# Patient Record
Sex: Male | Born: 1990 | Race: Black or African American | Hispanic: No | Marital: Single | Smoking: Never smoker
Health system: Southern US, Community
[De-identification: ages and names within clinical notes are randomized; demographics above are authoritative.]

## PROBLEM LIST (undated history)

## (undated) ENCOUNTER — Emergency Department (HOSPITAL_COMMUNITY): Admission: EM | Discharge status: Absent without leave | Payer: Self-pay | Source: Home / Self Care

## (undated) DIAGNOSIS — Z21 Asymptomatic human immunodeficiency virus [HIV] infection status: Secondary | ICD-10-CM

## (undated) DIAGNOSIS — B20 Human immunodeficiency virus [HIV] disease: Secondary | ICD-10-CM

## (undated) HISTORY — DX: Asymptomatic human immunodeficiency virus (hiv) infection status: Z21

## (undated) HISTORY — DX: Human immunodeficiency virus (HIV) disease: B20

---
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
## 2010-06-04 ENCOUNTER — Emergency Department (HOSPITAL_COMMUNITY): Admission: EM | Admit: 2010-06-04 | Discharge: 2010-06-04 | Payer: Self-pay | Admitting: Family Medicine

## 2011-01-11 ENCOUNTER — Emergency Department (HOSPITAL_COMMUNITY): Payer: Medicaid Other

## 2011-01-11 ENCOUNTER — Emergency Department (HOSPITAL_COMMUNITY)
Admission: EM | Admit: 2011-01-11 | Discharge: 2011-01-11 | Disposition: A | Discharge status: Home / Self Care | Payer: Medicaid Other | Attending: Emergency Medicine | Admitting: Emergency Medicine

## 2011-01-11 DIAGNOSIS — N433 Hydrocele, unspecified: Secondary | ICD-10-CM | POA: Insufficient documentation

## 2011-01-11 DIAGNOSIS — N5089 Other specified disorders of the male genital organs: Secondary | ICD-10-CM | POA: Insufficient documentation

## 2011-01-11 DIAGNOSIS — N390 Urinary tract infection, site not specified: Secondary | ICD-10-CM | POA: Insufficient documentation

## 2011-01-11 DIAGNOSIS — N509 Disorder of male genital organs, unspecified: Secondary | ICD-10-CM | POA: Insufficient documentation

## 2011-01-11 LAB — URINALYSIS, ROUTINE W REFLEX MICROSCOPIC
Ketones, ur: NEGATIVE mg/dL
Nitrite: NEGATIVE
Protein, ur: NEGATIVE mg/dL

## 2011-01-13 LAB — URINE CULTURE
Colony Count: NO GROWTH
Culture: NO GROWTH

## 2011-01-14 LAB — GC/CHLAMYDIA PROBE AMP, GENITAL
Chlamydia, DNA Probe: NEGATIVE
GC Probe Amp, Genital: POSITIVE — AB

## 2011-10-25 ENCOUNTER — Emergency Department (INDEPENDENT_AMBULATORY_CARE_PROVIDER_SITE_OTHER)
Admission: EM | Admit: 2011-10-25 | Discharge: 2011-10-25 | Disposition: A | Discharge status: Home / Self Care | Payer: Self-pay | Source: Home / Self Care | Attending: Family Medicine | Admitting: Family Medicine

## 2011-10-25 ENCOUNTER — Encounter (HOSPITAL_COMMUNITY): Payer: Self-pay

## 2011-10-25 DIAGNOSIS — J069 Acute upper respiratory infection, unspecified: Secondary | ICD-10-CM

## 2011-10-25 LAB — POCT RAPID STREP A: Streptococcus, Group A Screen (Direct): NEGATIVE

## 2011-10-25 MED ORDER — IBUPROFEN 600 MG PO TABS: 600.0000 mg | ORAL_TABLET | Freq: Three times a day (TID) | ORAL | Status: AC | PRN

## 2011-10-25 NOTE — Discharge Instructions (Signed)
Your rapid strep test is negative. Is very important top keep well hydrated. Can take ibuprofen scheduled every 8 hours for the next 24-48 hours take with food and plenty of liquids as it can upset your stomach, can take over the counter prilosec while taking ibuprofen. Can also alternate with tylenol every 6 hours as needed for pain or fever. Use nasal saline spray at least 3 times a day. (simply saline is over the counter) Can use steam water (vaporizer) at bed side overnight.  Return if difficulty breathing or not keeping fluids down.

## 2011-10-25 NOTE — ED Notes (Signed)
Pt has fever, body aches, and sorethroat for 1 and 1/2 weeks.

## 2011-10-25 NOTE — ED Provider Notes (Signed)
History     CSN: [id number]  Arrival date & time 10/25/11  4098   First MD Initiated Contact with Patient 10/25/11 1114      Chief Complaint  Patient presents with  . URI    (Consider location/radiation/quality/duration/timing/severity/associated sxs/prior treatment) HPI Comments: 21 y/o male non smoker. No significant PMH here c/o sore throat,  body aches and fever  For 1 and 1/2 week. Mild non productive cough. Taking tylenol and pseudoephedrine last dose last night afebrile here. No chest pain or shortness of breath.     History reviewed. No pertinent past medical history.  History reviewed. No pertinent past surgical history.  History reviewed. No pertinent family history.  History  Substance Use Topics  . Smoking status: Not on file  . Smokeless tobacco: Not on file  . Alcohol Use: No      Review of Systems  Constitutional: Negative for fever and chills.  HENT: Positive for congestion and sore throat. Negative for ear pain, rhinorrhea, trouble swallowing, neck pain and neck stiffness.   Eyes: Negative for discharge and redness.  Respiratory: Negative for cough, chest tightness and shortness of breath.   Gastrointestinal: Negative for nausea, vomiting, abdominal pain and diarrhea.  Musculoskeletal: Positive for myalgias, back pain and arthralgias. Negative for joint swelling.  Skin: Negative for rash.  Neurological: Negative for headaches.    Allergies  Review of patient's allergies indicates no known allergies.  Home Medications   Current Outpatient Rx  Name Route Sig Dispense Refill  . ACETAMINOPHEN 500 MG PO TABS Oral Take 500 mg by mouth every 6 (six) hours as needed.    [name] Kitchen PSEUDOEPHEDRINE-ACETAMINOPHEN 30-500 MG PO TABS Oral Take 1 tablet by mouth every 4 (four) hours as needed.    . IBUPROFEN 600 MG PO TABS Oral Take 1 tablet (600 mg total) by mouth every 8 (eight) hours as needed for pain or fever (take with food). 20 tablet 0    BP 123/79  Pulse 89   Temp(Src) 98.9 F (37.2 C) (Oral)  Resp 16  SpO2 98%  Physical Exam  Nursing note and vitals reviewed. Constitutional: He is oriented to person, place, and time. He appears well-developed and well-nourished. No distress.  HENT:  Head: Normocephalic and atraumatic.  Right Ear: External ear normal.  Left Ear: External ear normal.       Mild nasal congestion with erythema and swelling of nasal turbinates, clear rhinorrhea. Mild pharyngeal erythema no exudates. No uvula deviation. No trismus. TM's normal   Eyes: Conjunctivae are normal. Pupils are equal, round, and reactive to light. Right eye exhibits no discharge. Left eye exhibits no discharge.  Neck: Normal range of motion. Neck supple.  Cardiovascular: Normal heart sounds.   Pulmonary/Chest: Effort normal and breath sounds normal. No respiratory distress. He has no wheezes. He has no rales. He exhibits no tenderness.  Abdominal: Soft. He exhibits no distension. There is no tenderness.  Lymphadenopathy:    He has no cervical adenopathy.  Neurological: He is alert and oriented to person, place, and time.  Skin: No rash noted.    ED Course  Procedures (including critical care time)   Labs Reviewed  POCT RAPID STREP A ([facility] ONLY)  LAB REPORT - SCANNED   No results found.   1. URI (upper respiratory infection)       MDM  Negative rapid strep test. Clinically well treated symptomatically.        Sharin Grave, MD 10/26/11 1231

## 2012-03-12 ENCOUNTER — Encounter (HOSPITAL_COMMUNITY): Payer: Self-pay

## 2012-03-12 ENCOUNTER — Emergency Department (INDEPENDENT_AMBULATORY_CARE_PROVIDER_SITE_OTHER)
Admission: EM | Admit: 2012-03-12 | Discharge: 2012-03-12 | Disposition: A | Discharge status: Home / Self Care | Payer: Self-pay | Source: Home / Self Care | Attending: Family Medicine | Admitting: Family Medicine

## 2012-03-12 DIAGNOSIS — J039 Acute tonsillitis, unspecified: Secondary | ICD-10-CM

## 2012-03-12 LAB — POCT RAPID STREP A: Streptococcus, Group A Screen (Direct): NEGATIVE

## 2012-03-12 MED ORDER — AMOXICILLIN-POT CLAVULANATE 875-125 MG PO TABS: 1.0000 | ORAL_TABLET | Freq: Two times a day (BID) | ORAL | Status: AC

## 2012-03-12 NOTE — Discharge Instructions (Signed)
Drink lots of fluids, take all of medicine, use lozenges as needed.return if needed °

## 2012-03-12 NOTE — ED Provider Notes (Signed)
History     CSN: [id number]  Arrival date & time 03/12/12  1311   First MD Initiated Contact with Patient 03/12/12 1410      Chief Complaint  Patient presents with  . Sore Throat    (Consider location/radiation/quality/duration/timing/severity/associated sxs/prior treatment) Patient is a 21 y.o. male presenting with pharyngitis. The history is provided by the patient.  Sore Throat This is a new problem. The current episode started 2 days ago. The problem has been gradually worsening. The symptoms are aggravated by swallowing.    History reviewed. No pertinent past medical history.  History reviewed. No pertinent past surgical history.  History reviewed. No pertinent family history.  History  Substance Use Topics  . Smoking status: Never Smoker   . Smokeless tobacco: Not on file  . Alcohol Use: No      Review of Systems  Constitutional: Positive for chills.  HENT: Positive for ear pain, congestion, sore throat and rhinorrhea.   Respiratory: Negative for cough.   Hematological: Positive for adenopathy.    Allergies  Review of patient's allergies indicates no known allergies.  Home Medications   Current Outpatient Rx  Name Route Sig Dispense Refill  . AMOXICILLIN-POT CLAVULANATE 875-125 MG PO TABS Oral Take 1 tablet by mouth 2 (two) times daily. 20 tablet 0    BP 157/96  Pulse 74  Temp 98.9 F (37.2 C) (Oral)  Resp 18  SpO2 98%  Physical Exam  Nursing note and vitals reviewed. Constitutional: He appears well-developed and well-nourished.  HENT:  Head: Normocephalic.  Right Ear: External ear normal.  Left Ear: External ear normal.  Mouth/Throat: Uvula is midline and mucous membranes are normal. Oropharyngeal exudate and posterior oropharyngeal erythema present.  Lymphadenopathy:    He has cervical adenopathy.  Skin: No rash noted.    ED Course  Procedures (including critical care time)  Labs Reviewed  GLUCOSE, CAPILLARY - Abnormal; Notable for  the following:    Glucose-Capillary 106 (*)     All other components within normal limits  POCT RAPID STREP A ([facility] ONLY)   No results found.   1. Tonsillitis with exudate       MDM          Linna Hoff, MD 03/21/12 1234

## 2012-03-12 NOTE — ED Notes (Signed)
Cough for 2 weeks, ear pressure, discomfort, ST, dry cough; tonsils swollen, red , exudatve

## 2012-05-24 IMAGING — US US ART/VEN ABD/PELV/SCROTUM DOPPLER LTD
1 series · 14 of 25 positions shown · non-contrast
Comparison: None

CLINICAL DATA: right testicular swelling, pain.

SCROTAL ULTRASOUND
DOPPLER ULTRASOUND OF THE TESTICLES
TECHNIQUE: Complete ultrasound examination of the testicles,
epididymis, and other scrotal structures was performed.  Color and
spectral Doppler ultrasound were also utilized to evaluate blood
flow to the testicles.

[Series 1: us art/ven abd/pelv/scrotum doppler ltd · 0.08mm/px · 14 of 54 slices shown]
[im 1/54]
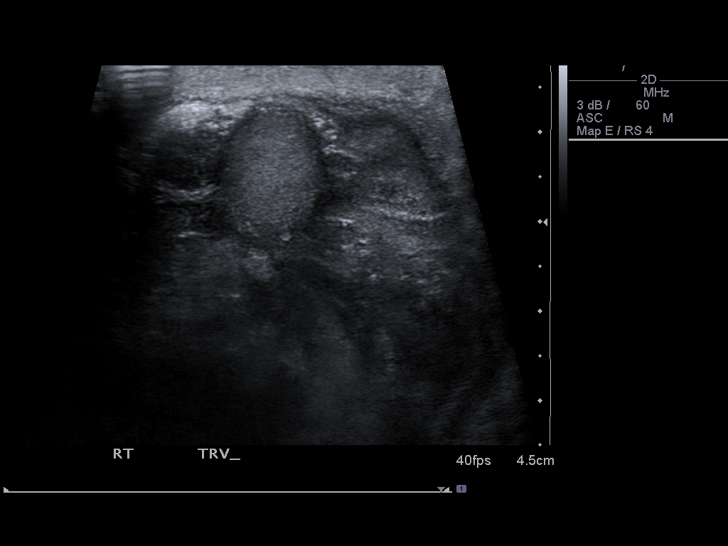
[im 5/54]
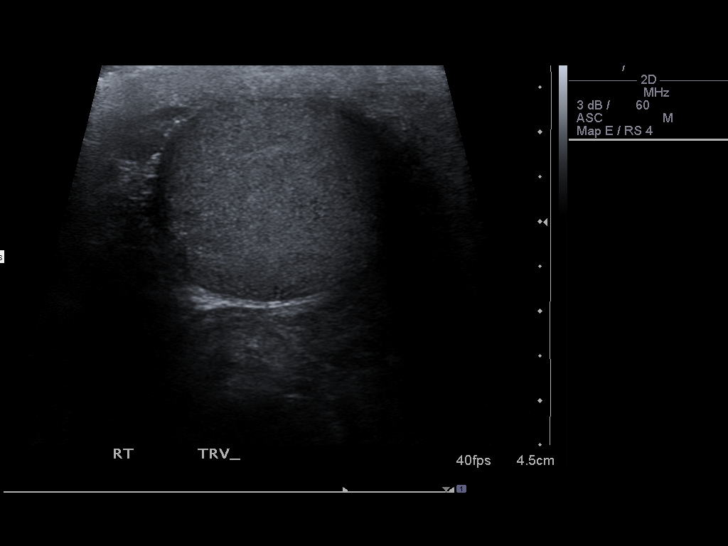
[im 9/54]
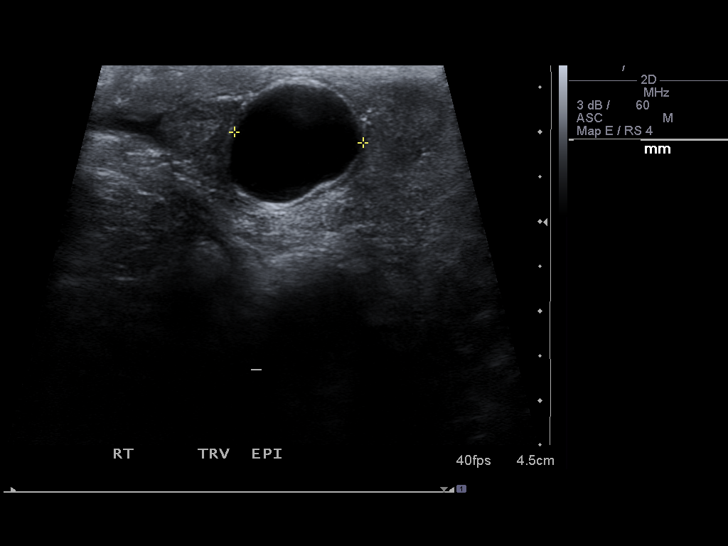
[im 14/54]
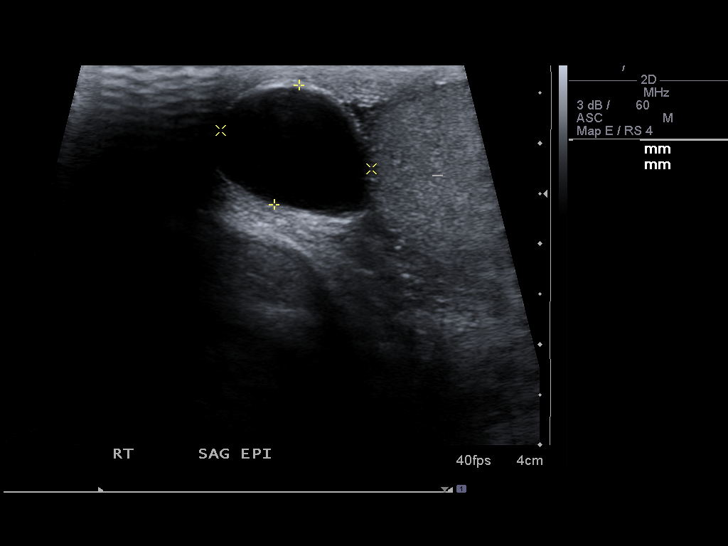
[im 18/54]
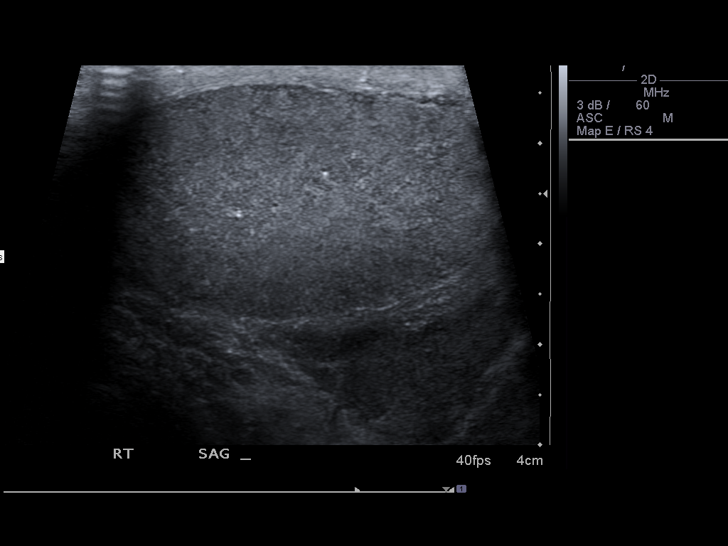
[im 20/54]
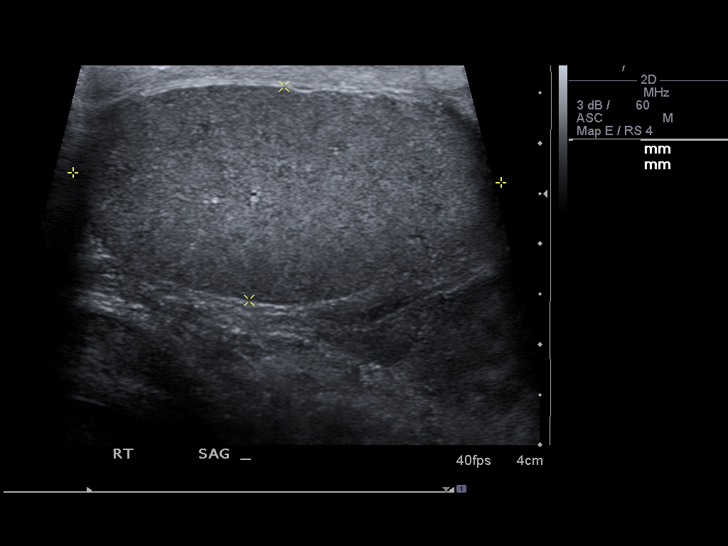
[im 25/54]
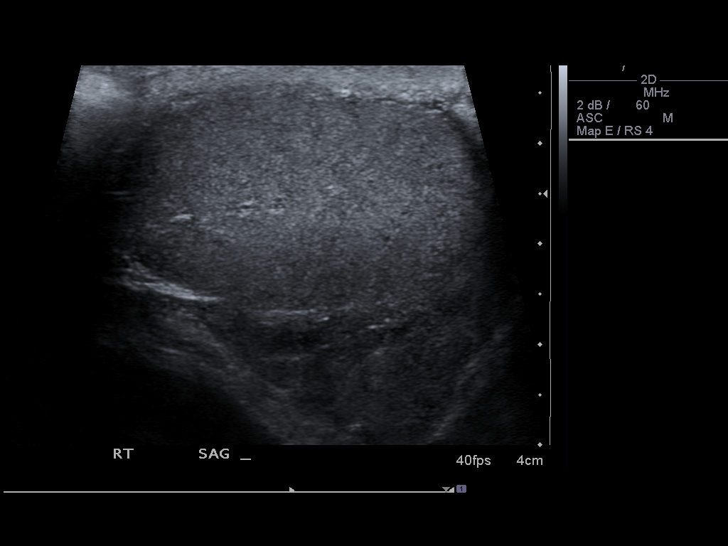
[im 29/54]
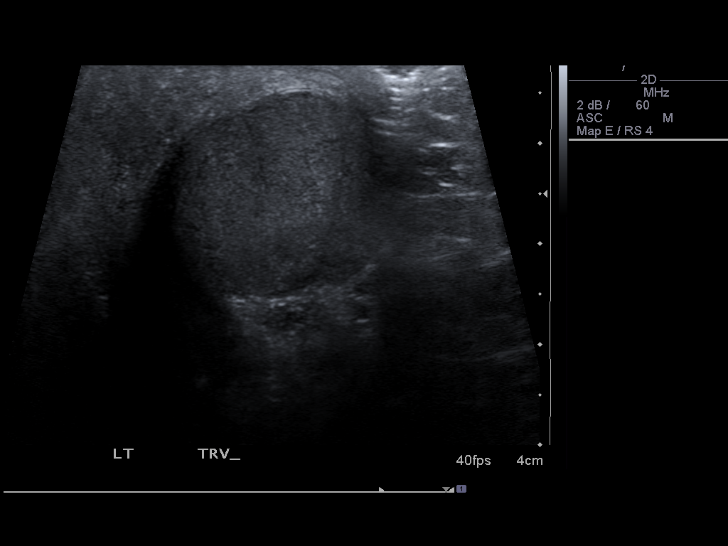
[im 34/54]
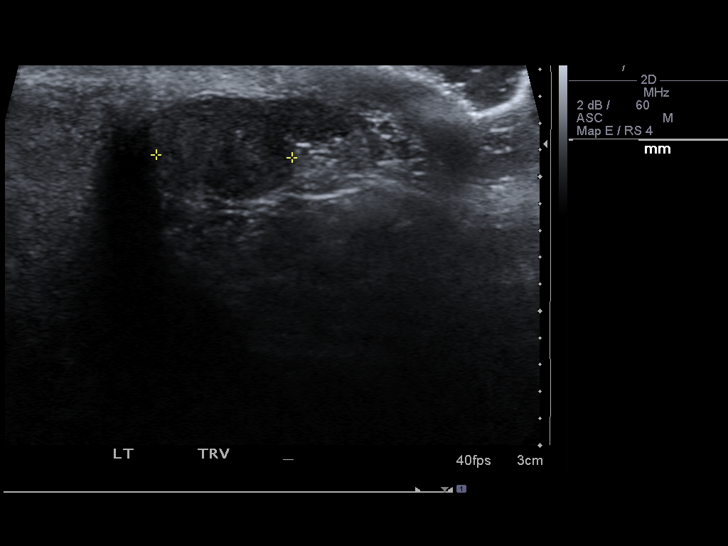
[im 36/54]
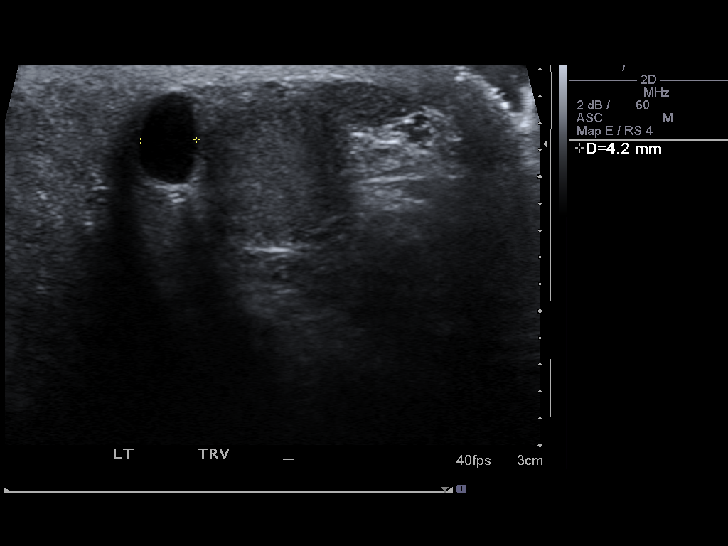
[im 40/54]
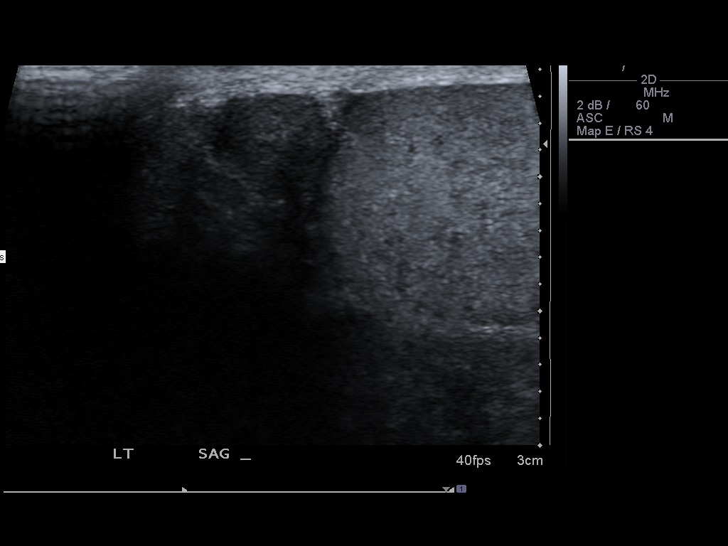
[im 45/54]
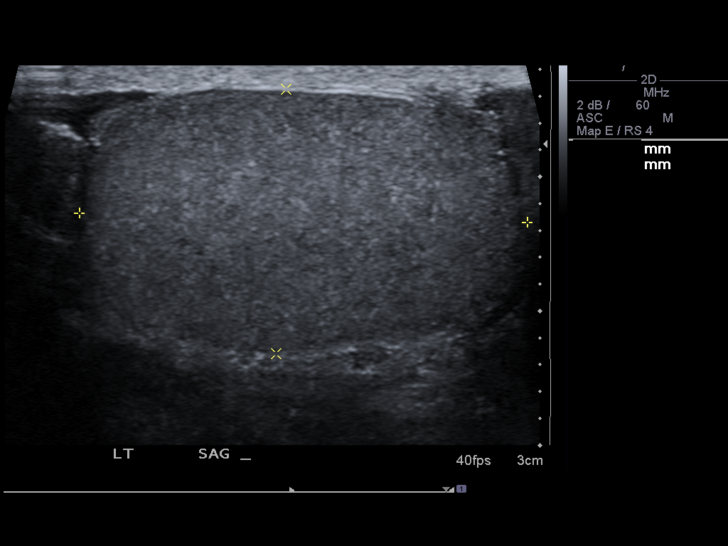
[im 49/54]
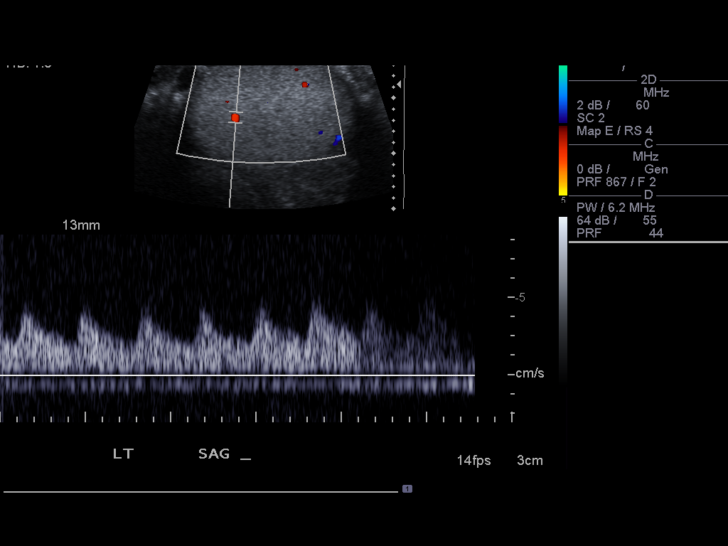
[im 54/54]
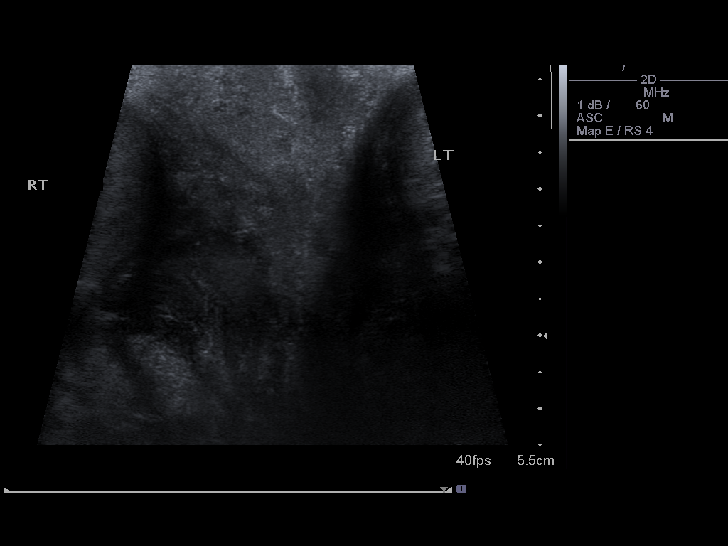

[14 of 25 positions shown; findings below may reference images not displayed]

FINDINGS: Right testicle measures 4.3 x 2.2 x 2.7cm.  Left testicle
measures 3.3 x 2.0 x 2.3cm.  Normal size and symmetric and
echotexture.  No focal masses.  No evidence of torsion.  Normal
arterial and venous blood flow documented.

Bilateral epididymal cysts, right larger than left.  There are
small hydroceles, right slightly larger than left.  No varicocele.
IMPRESSION: Testes are unremarkable.  No evidence of torsion.

Bilateral epididymal head cysts and hydroceles, right larger than
left.

## 2012-07-14 ENCOUNTER — Encounter (HOSPITAL_COMMUNITY): Payer: Self-pay

## 2012-07-14 ENCOUNTER — Emergency Department (HOSPITAL_COMMUNITY)
Admission: EM | Admit: 2012-07-14 | Discharge: 2012-07-14 | Disposition: A | Discharge status: Home / Self Care | Payer: Self-pay | Attending: Emergency Medicine | Admitting: Emergency Medicine

## 2012-07-14 DIAGNOSIS — Z23 Encounter for immunization: Secondary | ICD-10-CM | POA: Insufficient documentation

## 2012-07-14 DIAGNOSIS — L0231 Cutaneous abscess of buttock: Secondary | ICD-10-CM | POA: Insufficient documentation

## 2012-07-14 DIAGNOSIS — L0291 Cutaneous abscess, unspecified: Secondary | ICD-10-CM

## 2012-07-14 DIAGNOSIS — L03317 Cellulitis of buttock: Secondary | ICD-10-CM | POA: Insufficient documentation

## 2012-07-14 MED ORDER — OXYCODONE-ACETAMINOPHEN 5-325 MG PO TABS
2.0000 | ORAL_TABLET | Freq: Once | ORAL | Status: AC
  Administered 2012-07-14: 2 via ORAL
  Filled 2012-07-14: qty 2

## 2012-07-14 MED ORDER — TETANUS-DIPHTH-ACELL PERTUSSIS 5-2.5-18.5 LF-MCG/0.5 IM SUSP
0.5000 mL | Freq: Once | INTRAMUSCULAR | Status: AC
  Administered 2012-07-14: 0.5 mL via INTRAMUSCULAR
  Filled 2012-07-14: qty 0.5

## 2012-07-14 MED ORDER — IBUPROFEN 800 MG PO TABS: 800.0000 mg | ORAL_TABLET | Freq: Three times a day (TID) | ORAL | Status: DC

## 2012-07-14 MED ORDER — OXYCODONE-ACETAMINOPHEN 5-325 MG PO TABS: 1.0000 | ORAL_TABLET | ORAL | Status: DC | PRN

## 2012-07-14 MED ORDER — LIDOCAINE HCL 2 % IJ SOLN
10.0000 mL | Freq: Once | INTRAMUSCULAR | Status: AC
  Administered 2012-07-14: 200 mg via INTRADERMAL

## 2012-07-14 NOTE — ED Notes (Signed)
Pt complains of abcess to bottom, sts hx of same, and this time it is not subsiding. Denies draingage.

## 2012-07-14 NOTE — ED Notes (Signed)
Pt given extra supplies for dressing change at home. Verbalized understanding of discharge instructions.

## 2012-07-14 NOTE — ED Provider Notes (Signed)
History     CSN: [id number]  Arrival date & time 07/14/12  1346   First MD Initiated Contact with Patient 07/14/12 1417      Chief Complaint  Patient presents with  . Abscess    (Consider location/radiation/quality/duration/timing/severity/associated sxs/prior treatment) Patient is a 21 y.o. male presenting with abscess. The history is provided by the patient. No language interpreter was used.  Abscess  This is a recurrent problem. The current episode started less than one week ago. The onset was gradual. The problem occurs rarely. The problem has been gradually worsening. Affected Location: buttock crack. The abscess is characterized by redness and painfulness.    No past medical history on file.  No past surgical history on file.  No family history on file.  History  Substance Use Topics  . Smoking status: Never Smoker   . Smokeless tobacco: Not on file  . Alcohol Use: No      Review of Systems  Constitutional: Negative.   HENT: Negative.   Eyes: Negative.   Respiratory: Negative.   Cardiovascular: Negative.   Gastrointestinal: Negative.   Neurological: Negative.   Psychiatric/Behavioral: Negative.   All other systems reviewed and are negative.    Allergies  Review of patient's allergies indicates no known allergies.  Home Medications  No current outpatient prescriptions on file.  BP 166/103  Pulse 85  Temp 98.5 F (36.9 C) (Oral)  Resp 18  SpO2 100%  Physical Exam  Nursing note and vitals reviewed. Constitutional: He is oriented to person, place, and time. He appears well-developed and well-nourished.  HENT:  Head: Normocephalic.  Eyes: Conjunctivae normal and EOM are normal. Pupils are equal, round, and reactive to light.  Neck: Normal range of motion. Neck supple.  Cardiovascular: Normal rate.   Pulmonary/Chest: Effort normal.  Abdominal: Soft.  Musculoskeletal: Normal range of motion. He exhibits tenderness.       buttocks  Neurological:  He is alert and oriented to person, place, and time.  Skin: Skin is warm and dry.       Abscess to buttocks no cellulitis  Psychiatric: He has a normal mood and affect.    ED Course  INCISION AND DRAINAGE Date/Time: 07/14/2012 3:10 PM Performed by: Remi Haggard Authorized by: Remi Haggard Consent: Verbal consent obtained. Written consent not obtained. Risks and benefits: risks, benefits and alternatives were discussed Consent given by: patient Patient understanding: patient states understanding of the procedure being performed Patient identity confirmed: verbally with patient, arm band, provided demographic data and hospital-assigned identification number Time out: Immediately prior to procedure a "time out" was called to verify the correct patient, procedure, equipment, support staff and site/side marked as required. Type: abscess Location: buttocks. Anesthesia: local infiltration Local anesthetic: lidocaine 2% without epinephrine Anesthetic total: 5 ml Patient sedated: no Scalpel size: 11 Complexity: simple Drainage: purulent and serosanguinous Drainage amount: moderate Wound treatment: wound left open Packing material: 1/4 in iodoform gauze Patient tolerance: Patient tolerated the procedure well with no immediate complications.   (including critical care time)  Labs Reviewed - No data to display No results found.   No diagnosis found.    MDM  I & D abscess without cellulitis to buttocks packed.  Return in 24 hours for packing removal and recheck.  No pcp.  Tetanus updated.  rx for percocet/ibuprofen.        Remi Haggard, NP 07/15/12 1912

## 2012-07-15 NOTE — ED Provider Notes (Signed)
Medical screening examination/treatment/procedure(s) were performed by non-physician practitioner and as supervising physician I was immediately available for consultation/collaboration.  Toy Baker, MD 07/15/12 2223

## 2012-09-26 ENCOUNTER — Encounter (HOSPITAL_COMMUNITY): Payer: Self-pay | Admitting: Adult Health

## 2012-09-26 ENCOUNTER — Emergency Department (HOSPITAL_COMMUNITY)
Admission: EM | Admit: 2012-09-26 | Discharge: 2012-09-26 | Disposition: A | Discharge status: Home / Self Care | Payer: Self-pay | Attending: Emergency Medicine | Admitting: Emergency Medicine

## 2012-09-26 DIAGNOSIS — R51 Headache: Secondary | ICD-10-CM | POA: Insufficient documentation

## 2012-09-26 DIAGNOSIS — J029 Acute pharyngitis, unspecified: Secondary | ICD-10-CM | POA: Insufficient documentation

## 2012-09-26 MED ORDER — OXYCODONE-ACETAMINOPHEN 5-325 MG PO TABS: 2.0000 | ORAL_TABLET | ORAL | Status: DC | PRN

## 2012-09-26 MED ORDER — DYCLONINE HCL 3 MG MT LOZG: 1.0000 | LOZENGE | OROMUCOSAL | Status: DC | PRN

## 2012-09-26 MED ORDER — BENZOCAINE-MENTHOL 15-10 MG MT LOZG: 1.0000 | LOZENGE | OROMUCOSAL | Status: DC | PRN

## 2012-09-26 MED ORDER — IBUPROFEN 800 MG PO TABS: 800.0000 mg | ORAL_TABLET | Freq: Three times a day (TID) | ORAL | Status: DC

## 2012-09-26 NOTE — ED Provider Notes (Signed)
History   This chart was scribed for Sergio Skene, MD by Melba Coon, ED Scribe. The patient was seen in room TR09C/TR09C and the patient's care was started at 7:28PM.   CSN: [id number]  Arrival date & time 09/26/12  1814   First MD Initiated Contact with Patient 09/26/12 1918      Chief Complaint  Patient presents with  . Sore Throat    (Consider location/radiation/quality/duration/timing/severity/associated sxs/prior treatment) The history is provided by the patient. No language interpreter was used.   Sergio Hatfield is a 22 y.o. male who presents to the Emergency Department complaining of persistent, moderate to severe sore throat with an onset yesterday. He reports that the pain started on the left side of his throat but has moved to the right side of his throat. He reports that ibuprofen, Tylenol and percocet have not alleviated the pain. Reports headache, difficulty swallowing, and muffled voice change. Denies cough, nasal congestion, rhinnorhea, fever, neck pain, rash, back pain, CP, SOB, abdominal pain, nausea, emesis, diarrhea, dysuria, or extremity pain, edema, weakness, numbness, or tingling. No known allergies. No other pertinent medical symptoms.  History reviewed. No pertinent past medical history.  History reviewed. No pertinent past surgical history.  History reviewed. No pertinent family history.  History  Substance Use Topics  . Smoking status: Never Smoker   . Smokeless tobacco: Not on file  . Alcohol Use: No      Review of Systems 10 Systems reviewed and all are negative for acute change except as noted in the HPI.   Allergies  Review of patient's allergies indicates no known allergies.  Home Medications  No current outpatient prescriptions on file.  BP 149/84  Pulse 80  Temp 98.5 F (36.9 C) (Oral)  Resp 18  SpO2 99%  Physical Exam  Nursing notes reviewed.  Electronic medical record reviewed. VITAL SIGNS:   Filed Vitals:   09/26/12 1816   BP: 149/84  Pulse: 80  Temp: 98.5 F (36.9 C)  TempSrc: Oral  Resp: 18  SpO2: 99%   CONSTITUTIONAL: Awake, oriented, appears non-toxic HENT: Atraumatic, normocephalic, oral mucosa pink and moist. Erythema to the tonsils, no exudate, mild swelling. Airway is patent - patient handling secretions and not having any trouble swallowing. Nares patent without drainage. External ears normal. EYES: Conjunctiva clear, EOMI, PERRLA NECK: Trachea midline, non-tender, supple CARDIOVASCULAR: Normal heart rate, Normal rhythm, No murmurs, rubs, gallops PULMONARY/CHEST: Clear to auscultation, no rhonchi, wheezes, or rales. Symmetrical breath sounds. Non-tender. ABDOMINAL: Non-distended, soft, non-tender - no rebound or guarding.  BS normal. NEUROLOGIC: Non-focal, moving all four extremities, no gross sensory or motor deficits. EXTREMITIES: No clubbing, cyanosis, or edema SKIN: Warm, Dry, No erythema, No rash  ED Course  Procedures (including critical care time)  COORDINATION OF CARE:  7:33PM - rapid strep screen and ibuprofen will be ordered for Sergio Hatfield.      Labs Reviewed  RAPID STREP SCREEN  LAB REPORT - SCANNED   No results found.   1. Viral pharyngitis       MDM  Sergio Hatfield is a 22 y.o. male presents with a sore throat. He was swabbed in triage, the rapid strep is negative - clinically he does not appear to have streptococcal pharyngitis. More likely has a viral syndrome upper respiratory infection and viral pharyngitis, given some pain medicine to go home with ibuprofen as well as a few cough drops with topical anesthetic properties to help him maintain his fluid hydration.  I explained  the diagnosis and have given explicit precautions to return to the ER including inability to swallow his saliva, worsening swelling, throat pain or any other new or worsening symptoms. The patient understands and accepts the medical plan as it's been dictated and I have answered their  questions. Discharge instructions concerning home care and prescriptions have been given.  The patient is STABLE and is discharged to home in good condition.  I personally performed the services described in this documentation, which was scribed in my presence. The recorded information has been reviewed and is accurate. Sergio Hatfield, M.D.          Sergio Skene, MD 09/28/12 1610

## 2012-09-26 NOTE — ED Notes (Signed)
Presents with sore throat for one week that has worsened over the last week. Throat red, associated with headache.

## 2012-09-26 NOTE — ED Notes (Signed)
MD at bedside. 

## 2012-09-26 NOTE — ED Notes (Signed)
Pt states that he does not know why he is being discharged since he has had a sore throat for the past 6 months and meds prescribed has not helped him previously.

## 2012-10-31 ENCOUNTER — Encounter (HOSPITAL_COMMUNITY): Payer: Self-pay | Admitting: Emergency Medicine

## 2012-10-31 ENCOUNTER — Emergency Department (HOSPITAL_COMMUNITY): Payer: Self-pay

## 2012-10-31 ENCOUNTER — Emergency Department (HOSPITAL_COMMUNITY)
Admission: EM | Admit: 2012-10-31 | Discharge: 2012-11-01 | Disposition: A | Discharge status: Home / Self Care | Payer: Medicaid Other | Attending: Emergency Medicine | Admitting: Emergency Medicine

## 2012-10-31 DIAGNOSIS — R42 Dizziness and giddiness: Secondary | ICD-10-CM | POA: Insufficient documentation

## 2012-10-31 DIAGNOSIS — R51 Headache: Secondary | ICD-10-CM | POA: Insufficient documentation

## 2012-10-31 DIAGNOSIS — R519 Headache, unspecified: Secondary | ICD-10-CM | POA: Diagnosis present

## 2012-10-31 DIAGNOSIS — R509 Fever, unspecified: Secondary | ICD-10-CM | POA: Diagnosis present

## 2012-10-31 LAB — CBC WITH DIFFERENTIAL/PLATELET
Basophils Relative: 1 % (ref 0–1)
Eosinophils Relative: 0 % (ref 0–5)
Hemoglobin: 16.1 g/dL (ref 13.0–17.0)
MCH: 29 pg (ref 26.0–34.0)
Monocytes Absolute: 0.6 10*3/uL (ref 0.1–1.0)
Monocytes Relative: 6 % (ref 3–12)
Neutrophils Relative %: 70 % (ref 43–77)
RBC: 5.55 MIL/uL (ref 4.22–5.81)

## 2012-10-31 LAB — BASIC METABOLIC PANEL
BUN: 9 mg/dL (ref 6–23)
CO2: 25 mEq/L (ref 19–32)
Chloride: 101 mEq/L (ref 96–112)
Glucose, Bld: 107 mg/dL — ABNORMAL HIGH (ref 70–99)
Potassium: 3.9 mEq/L (ref 3.5–5.1)

## 2012-10-31 MED ORDER — ACETAMINOPHEN 325 MG PO TABS
650.0000 mg | ORAL_TABLET | Freq: Once | ORAL | Status: AC
  Administered 2012-10-31: 650 mg via ORAL
  Filled 2012-10-31: qty 2

## 2012-10-31 MED ORDER — ONDANSETRON HCL 4 MG/2ML IJ SOLN
4.0000 mg | Freq: Once | INTRAMUSCULAR | Status: AC
  Administered 2012-10-31: 4 mg via INTRAVENOUS
  Filled 2012-10-31: qty 2

## 2012-10-31 MED ORDER — FENTANYL CITRATE 0.05 MG/ML IJ SOLN
50.0000 ug | Freq: Once | INTRAMUSCULAR | Status: AC
  Administered 2012-10-31: 50 ug via INTRAVENOUS
  Filled 2012-10-31: qty 2

## 2012-10-31 MED ORDER — DEXAMETHASONE SODIUM PHOSPHATE 10 MG/ML IJ SOLN
10.0000 mg | Freq: Once | INTRAMUSCULAR | Status: AC
  Administered 2012-10-31: 10 mg via INTRAVENOUS
  Filled 2012-10-31: qty 1

## 2012-10-31 MED ORDER — SODIUM CHLORIDE 0.9 % IV BOLUS (SEPSIS)
1000.0000 mL | Freq: Once | INTRAVENOUS | Status: AC
  Administered 2012-10-31: 1000 mL via INTRAVENOUS

## 2012-10-31 MED ORDER — DIPHENHYDRAMINE HCL 50 MG/ML IJ SOLN
25.0000 mg | Freq: Once | INTRAMUSCULAR | Status: AC
  Administered 2012-10-31: 25 mg via INTRAVENOUS
  Filled 2012-10-31: qty 1

## 2012-10-31 NOTE — ED Notes (Signed)
Lumbar puncture tray set up at bedside; PA and EDP notified.

## 2012-10-31 NOTE — ED Notes (Signed)
Patient complaining of headache and dizziness for the last two weeks; felt that it was related to hypertension.  Patient reports that last time he was seen, his blood pressure was high, but does not have official diagnosis of hypertension.  Patient also complaining of body aches that started three days ago.  Denies recent cold symptoms and sore throat.  Patient alert and oriented x4; PERRL present.  Tiffany, PA at bedside.  Will continue to monitor.

## 2012-10-31 NOTE — ED Notes (Signed)
Phlebotomy at bedside.

## 2012-10-31 NOTE — ED Notes (Signed)
Patient currently resting quietly in bed; no respiratory or acute distress noted.  Patient requesting family member to be brought back; family member called in waiting room.  Patient denies any needs at this time; will continue to monitor.

## 2012-10-31 NOTE — ED Notes (Signed)
Consent form for lumbar puncture signed by patient; placed in patient's chart.

## 2012-10-31 NOTE — ED Notes (Signed)
Pt c/o frontal HA x 2 weeks with fever and body aches x unknown amount of time; pt sts some dizziness when standing

## 2012-10-31 NOTE — ED Notes (Signed)
Patient currently sitting up in bed; no respiratory or acute distress noted.  Patient updated on plan of care; informed patient that we are currently waiting on radiology to call and say that they are ready for patient.  Patient denies any other needs at this time; will continue to monitor.

## 2012-10-31 NOTE — ED Provider Notes (Signed)
History     CSN: [id number]  Arrival date & time 10/31/12  1530   First MD Initiated Contact with Patient 10/31/12 1734      Chief Complaint  Patient presents with  . Fever  . Headache    (Consider location/radiation/quality/duration/timing/severity/associated sxs/prior treatment) HPI  Pt to the ER with complaints of headache which is worse in the front and fever for 2 weeks. He says that the headache has been mild for the past two weeks but the fever started to rise within the past few days. Yesterday and today he has remained in bed with fever and feels sick. He describes his headache as 10/10 without neck pain. No decrease in vision, body aches, nasal congestion, sore throat, ear pain, N/V/D. The patient is diaphoretic and appears toxic. He is responding to my questions appropriately. He is febrile and tachycardia.    History reviewed. No pertinent past medical history.  History reviewed. No pertinent past surgical history.  History reviewed. No pertinent family history.  History  Substance Use Topics  . Smoking status: Never Smoker   . Smokeless tobacco: Not on file  . Alcohol Use: No      Review of Systems  Review of Systems  Gen: no weight loss,, chills, night sweats +fevers Eyes: no discharge or drainage, no occular pain or visual changes  Nose: no epistaxis or rhinorrhea  Mouth: no dental pain, no sore throat  Neck: no neck pain  Lungs:No wheezing, coughing or hemoptysis CV: no chest pain, palpitations, dependent edema or orthopnea  Abd: no abdominal pain, nausea, vomiting  GU: no dysuria or gross hematuria  MSK:  No abnormalities  Neuro: + headache, no focal neurologic deficits  Skin: no abnormalities Psyche: negative.   Allergies  Review of patient's allergies indicates no known allergies.  Home Medications  No current outpatient prescriptions on file.  BP 139/74  Pulse 117  Temp(Src) 99.6 F (37.6 C) (Oral)  Resp 18  SpO2 100%  Physical  Exam  Nursing note and vitals reviewed. Constitutional: He is oriented to person, place, and time. He appears well-developed and well-nourished. No distress.  Pt appears toxic  HENT:  Head: Normocephalic and atraumatic.  Eyes: Pupils are equal, round, and reactive to light.  Neck: Normal range of motion. Neck supple. Muscular tenderness present. No spinous process tenderness present. No rigidity. Normal range of motion present. No Brudzinski's sign and no Kernig's sign noted.  Cardiovascular: Normal rate and regular rhythm.   Pulmonary/Chest: Effort normal.  Abdominal: Soft.  Neurological: He is alert and oriented to person, place, and time. He has normal strength. No cranial nerve deficit or sensory deficit.  Skin: Skin is warm and dry.    ED Course  LUMBAR PUNCTURE Date/Time: 10/31/2012 9:21 PM Performed by: Dorthula Matas Authorized by: Dorthula Matas Consent: Verbal consent obtained. Risks and benefits: risks, benefits and alternatives were discussed Consent given by: patient Patient understanding: patient states understanding of the procedure being performed Site marked: the operative site was marked Patient identity confirmed: verbally with patient Time out: Immediately prior to procedure a "time out" was called to verify the correct patient, procedure, equipment, support staff and site/side marked as required. Anesthesia: local infiltration Local anesthetic: lidocaine 1% without epinephrine Anesthetic total: 10 ml Patient sedated: no Lumbar space: L3-L4 interspace Patient's position: sitting Needle gauge: 22 Needle length: 5.0 in Number of attempts: 2 Comments: Attempt was unsuccessful by myself and Dr. Anitra Lauth as needle not long enough to reach goal   (  including critical care time)  Labs Reviewed  BASIC METABOLIC PANEL - Abnormal; Notable for the following:    Glucose, Bld 107 (*)    All other components within normal limits  CBC WITH DIFFERENTIAL   No  results found.   No diagnosis found.    MDM  CRITICAL CARE Performed by: Dorthula Matas   Total critical care time: 35  Critical care time was exclusive of separately billable procedures and treating other patients.  Critical care was necessary to treat or prevent imminent or life-threatening deterioration.  Critical care was time spent personally by me on the following activities: development of treatment plan with patient and/or surrogate as well as nursing, discussions with consultants, evaluation of patient's response to treatment, examination of patient, obtaining history from patient or surrogate, ordering and performing treatments and interventions, ordering and review of laboratory studies, ordering and review of radiographic studies, pulse oximetry and re-evaluation of patient's condition.  Head CT normal. LP attempted but unable to reach CSF due to body habitus. The patient continues to have headache despite treatment. Fever has remained low grade at 99.6 but he continues to sweat mildly.   Radiology consulted to do LP, they agreed to do it. Pt has been taken to radiology and we are currently awaiting results of CSF from procedure.    After discussing with Dr. Theodoro Kalata- ordered Rocephin and Vancomycin as well as more fluids.  Pt handed off to Sabino Dick, NP and Dr. Dierdre Highman.   Dorthula Matas, PA 11/01/12 [phone]

## 2012-10-31 NOTE — ED Notes (Signed)
Patient currently resting quietly in bed; no respiratory or acute distress noted.  Patient updated on plan of are; informed patient that he is currently pending lumbar puncture.  Patient denies any needs at this time; will continue to monitor.

## 2012-10-31 NOTE — ED Notes (Signed)
Patient currently resting quietly in bed; no respiratory or acute distress noted.  Patient currently pending for DG lumbar puncture fluoro guide.  Radiology called and stated that procedure has been approved, but that radiologist was behind at this time.  Patient updated on delay; verbalized understand.  Denies any needs at this time.  Family members (now at bedside) given water to drink.  Will continue to monitor.

## 2012-11-01 ENCOUNTER — Emergency Department (HOSPITAL_COMMUNITY)
Admission: EM | Admit: 2012-11-01 | Discharge: 2012-11-01 | Disposition: A | Discharge status: Home / Self Care | Payer: Medicaid Other | Attending: Emergency Medicine | Admitting: Emergency Medicine

## 2012-11-01 ENCOUNTER — Encounter (HOSPITAL_COMMUNITY): Payer: Self-pay | Admitting: Nurse Practitioner

## 2012-11-01 DIAGNOSIS — A879 Viral meningitis, unspecified: Secondary | ICD-10-CM | POA: Insufficient documentation

## 2012-11-01 DIAGNOSIS — R519 Headache, unspecified: Secondary | ICD-10-CM | POA: Diagnosis present

## 2012-11-01 DIAGNOSIS — R509 Fever, unspecified: Secondary | ICD-10-CM | POA: Insufficient documentation

## 2012-11-01 DIAGNOSIS — M542 Cervicalgia: Secondary | ICD-10-CM | POA: Insufficient documentation

## 2012-11-01 DIAGNOSIS — M545 Low back pain, unspecified: Secondary | ICD-10-CM | POA: Insufficient documentation

## 2012-11-01 DIAGNOSIS — R51 Headache: Secondary | ICD-10-CM

## 2012-11-01 DIAGNOSIS — Z79899 Other long term (current) drug therapy: Secondary | ICD-10-CM | POA: Insufficient documentation

## 2012-11-01 LAB — CBC WITH DIFFERENTIAL/PLATELET
Basophils Absolute: 0 10*3/uL (ref 0.0–0.1)
Basophils Relative: 0 % (ref 0–1)
HCT: 41.4 % (ref 39.0–52.0)
MCH: 28.4 pg (ref 26.0–34.0)
MCHC: 33.1 g/dL (ref 30.0–36.0)
MCV: 85.9 fL (ref 78.0–100.0)
Neutro Abs: 4.8 10*3/uL (ref 1.7–7.7)
Neutrophils Relative %: 61 % (ref 43–77)
RBC: 4.82 MIL/uL (ref 4.22–5.81)
RDW: 13.6 % (ref 11.5–15.5)
WBC: 7.9 10*3/uL (ref 4.0–10.5)

## 2012-11-01 LAB — CSF CELL COUNT WITH DIFFERENTIAL
Monocyte-Macrophage-Spinal Fluid: 22 % (ref 15–45)
RBC Count, CSF: 3640 /mm3 — ABNORMAL HIGH
RBC Count, CSF: 8595 /mm3 — ABNORMAL HIGH
Segmented Neutrophils-CSF: 67 % — ABNORMAL HIGH (ref 0–6)
WBC, CSF: 26 /mm3 (ref 0–5)
WBC, CSF: 28 /mm3 (ref 0–5)

## 2012-11-01 LAB — BASIC METABOLIC PANEL
Chloride: 102 mEq/L (ref 96–112)
GFR calc Af Amer: >90 mL/min (ref >90)
Potassium: 3.8 mEq/L (ref 3.5–5.1)

## 2012-11-01 LAB — PROTEIN AND GLUCOSE, CSF
Glucose, CSF: 77 mg/dL — ABNORMAL HIGH (ref 43–76)
Total  Protein, CSF: 52 mg/dL — ABNORMAL HIGH (ref 15–45)

## 2012-11-01 LAB — GRAM STAIN

## 2012-11-01 MED ORDER — HYDROCODONE-ACETAMINOPHEN 5-325 MG PO TABS: 1.0000 | ORAL_TABLET | Freq: Four times a day (QID) | ORAL | Status: DC | PRN

## 2012-11-01 MED ORDER — MORPHINE SULFATE 4 MG/ML IJ SOLN
4.0000 mg | Freq: Once | INTRAMUSCULAR | Status: AC
  Administered 2012-11-01: 4 mg via INTRAVENOUS
  Filled 2012-11-01: qty 1

## 2012-11-01 MED ORDER — SODIUM CHLORIDE 0.9 % IV BOLUS (SEPSIS)
1000.0000 mL | Freq: Once | INTRAVENOUS | Status: AC
  Administered 2012-11-01: 1000 mL via INTRAVENOUS

## 2012-11-01 MED ORDER — KETOROLAC TROMETHAMINE 30 MG/ML IJ SOLN
30.0000 mg | Freq: Once | INTRAMUSCULAR | Status: AC
  Administered 2012-11-01: 30 mg via INTRAVENOUS
  Filled 2012-11-01: qty 1

## 2012-11-01 MED ORDER — DEXTROSE 5 % IV SOLN
1.0000 g | Freq: Once | INTRAVENOUS | Status: AC
  Administered 2012-11-01: 1 g via INTRAVENOUS
  Filled 2012-11-01: qty 10

## 2012-11-01 MED ORDER — FENTANYL CITRATE 0.05 MG/ML IJ SOLN: 50.0000 ug | Freq: Once | INTRAMUSCULAR | Status: DC

## 2012-11-01 MED ORDER — OXYCODONE-ACETAMINOPHEN 5-325 MG PO TABS: 1.0000 | ORAL_TABLET | Freq: Four times a day (QID) | ORAL | Status: DC | PRN

## 2012-11-01 MED ORDER — OXYCODONE-ACETAMINOPHEN 5-325 MG PO TABS
2.0000 | ORAL_TABLET | Freq: Once | ORAL | Status: AC
  Administered 2012-11-01: 2 via ORAL
  Filled 2012-11-01: qty 2

## 2012-11-01 MED ORDER — IBUPROFEN 600 MG PO TABS: 600.0000 mg | ORAL_TABLET | Freq: Four times a day (QID) | ORAL | Status: DC | PRN

## 2012-11-01 MED ORDER — VANCOMYCIN HCL IN DEXTROSE 1-5 GM/200ML-% IV SOLN
1000.0000 mg | Freq: Once | INTRAVENOUS | Status: AC
  Administered 2012-11-01: 1000 mg via INTRAVENOUS
  Filled 2012-11-01: qty 200

## 2012-11-01 MED ORDER — METOCLOPRAMIDE HCL 5 MG/ML IJ SOLN
10.0000 mg | Freq: Once | INTRAMUSCULAR | Status: AC
  Administered 2012-11-01: 10 mg via INTRAVENOUS
  Filled 2012-11-01: qty 2

## 2012-11-01 NOTE — ED Notes (Signed)
Patient currently sitting up in bed; no respiratory or acute distress noted.  Patient  Updated on plan of care; informed patient that we are currently treating patient with two types of antibiotics.  Patient denies any needs at this time; will continue to monitor.

## 2012-11-01 NOTE — ED Provider Notes (Signed)
  Physical Exam  BP 138/72  Pulse 76  Temp(Src) 99.6 F (37.6 C) (Oral)  Resp 20  SpO2 94%  Physical Exam Pain slightly improved after IV antibiotics and Percocet Spinal fluid cells reviewed.  Greater than a 09-998 ratio between RBCs and WBCs, with equivocal results.  This raises concern for a true meningitis versus a contaminated tap, and the patient's persistent symptoms will admit for observation  ED Course  Procedures  MDM Dr. Ricci Barker  evaluated the patient and conferred with Dr. Ninetta Lights (ID)who reviewed labs, xrays, LP tap results and agree that patient can safely be discharged home with reevaluation in 12 hours or sooner if symptoms worsening.  Discussed this with the patient and family they voiced their understanding     Arman Filter, NP 11/01/12 0503  Arman Filter, NP 11/01/12 [phone]

## 2012-11-01 NOTE — ED Notes (Signed)
Patient currently asleep in bed; no respiratory or acute distress noted.  Family present at bedside; will continue to monitor.

## 2012-11-01 NOTE — ED Provider Notes (Signed)
Medical screening examination/treatment/procedure(s) were performed by non-physician practitioner and as supervising physician I was immediately available for consultation/collaboration.  Sunnie Nielsen, MD 11/01/12 [phone]

## 2012-11-01 NOTE — Consult Note (Signed)
[facility] Medical Consultation  JHOVANY WEIDINGER Sergio Hatfield:[id number] DOB: September 10, 1991 DOA: 10/31/2012 PCP: Default, Provider, MD   Requesting physician: Dr. Dierdre Highman ED Date of consultation: 11/01/12 Reason for consultation: Clearance for meningitis  Impression/Recommendations Principal Problem:   Headache Active Problems:   Fever    1. Headache and fever - I believe that this patient has a viral syndrome and the LP obtained represents a traumatic tap and not a bacterial meningitis.  I have confirmed this over the phone with Dr. Vernia Buff of ID who agrees with the above and recommends no ABX and no need for admission at this point in time.  Dr. Ninetta Lights confirms that this likely represents a traumatic tap in this scenario despite the > 09/998 ratio of WBC to RBC.  The patients headache has resolved during his stay in the ED.  His headache and fever had been going on for 2 weeks, and was not associated with meningismus, this HPI would not be consistent with bacterial meningitis, physical exam also does not find evidence of bacterial meningitis.  At this point I agree with Dr. Ninetta Lights and feel that the patient is safe to go home with close follow up.  I have discussed the above at length with the patient and his mother at bedside who agree that if symptoms worsen they will return right away.    Chief Complaint: Headache  HPI:  Mr. Duley is a 22 yo M who presents with a 2 week history of headache.  Symptoms havent been getting any worse in the past week and nothing seems to make them worse.  Treatment in the ED with percocet made it significantly better (resolved headache completely).  The headache is not associated with meningismus, nor stiff neck.  The patient is able to move his neck freely and has been for the past 2 weeks.  In the ED: CT head was negative, due to concern for bacterial meningitis an LP was performed which came back c/w a traumatic tap.  Review of Systems:  12  systems reviewed and otherwise negative.  History reviewed. No pertinent past medical history. History reviewed. No pertinent past surgical history. Social History:  reports that he has never smoked. He does not have any smokeless tobacco history on file. He reports that he does not drink alcohol or use illicit drugs.  No Known Allergies History reviewed. No pertinent family history.  Prior to Admission medications   Not on File   Physical Exam: Blood pressure 138/72, pulse 76, temperature 99.6 F (37.6 C), temperature source Oral, resp. rate 20, SpO2 94.00%. Filed Vitals:   11/01/12 0030 11/01/12 0115 11/01/12 0200 11/01/12 0245  BP: 135/57 132/74 137/69 138/72  Pulse: 89 88 83 76  Temp:      TempSrc:      Resp:      SpO2: 94% 96% 93% 94%    General:  NAD, sitting up comfortably on side of bed and able to ambulate without difficulty in hallway. Eyes: PEERLA EOMI ENT: mucous membranes moist Neck: supple w/o JVD Cardiovascular: RRR w/o MRG Respiratory: CTA B Abdomen: soft, nt, nd, bs+ Skin: no rash nor lesion Musculoskeletal: MAE, full ROM all 4 extremities Psychiatric: normal tone and affect Neurologic: AAOx3, grossly non-focal, no evidence of meningismus, patient able to touch chin to chest without any pain, patient is not at all encephalopathic  Labs on Admission:  Basic Metabolic Panel:  Recent Labs Lab 10/31/12 1550  NA 137  K 3.9  CL 101  CO2  25  GLUCOSE 107*  BUN 9  CREATININE 1.07  CALCIUM 9.9   Liver Function Tests: No results found for this basename: AST, ALT, ALKPHOS, BILITOT, PROT, ALBUMIN,  in the last 168 hours No results found for this basename: LIPASE, AMYLASE,  in the last 168 hours No results found for this basename: AMMONIA,  in the last 168 hours CBC:  Recent Labs Lab 10/31/12 1550  WBC 10.1  NEUTROABS 7.1  HGB 16.1  HCT 47.4  MCV 85.4  PLT 257   Cardiac Enzymes: No results found for this basename: CKTOTAL, CKMB, CKMBINDEX,  TROPONINI,  in the last 168 hours BNP: No components found with this basename: POCBNP,  CBG: No results found for this basename: GLUCAP,  in the last 168 hours  Radiological Exams on Admission: Ct Head Wo Contrast  10/31/2012  *RADIOLOGY REPORT*  Clinical Data: Fever, headache for several days  CT HEAD WITHOUT CONTRAST  Technique:  Contiguous axial images were obtained from the base of the skull through the vertex without contrast.  Comparison: None.  Findings: The ventricular system is normal in size and configuration and the septum is in a normal midline position.  The fourth ventricle and basilar cisterns appear normal.  No hemorrhage, mass lesion, or acute infarction is seen.  On bone window images no calvarial abnormality is noted.  IMPRESSION: Negative unenhanced CT of the brain.   Original Report Authenticated By: Dwyane Dee, M.D.    Dg Lumbar Puncture Fluoro Guide  11/01/2012  *RADIOLOGY REPORT*  Clinical Data: Fever and headache.  LUMBAR PUNCTURE FLUORO GUIDE  Comparison: Head CT from 10/31/2012  Procedure:  We were requested to perform a lumbar puncture after failed attempts in the emergency department.  I discussed the risks (including hemorrhage and infection, among others), benefits, and alternatives to fluoroscopically guided lumbar puncture with the patient.  He understood and elected to undergo the procedure.  We discussed the high technical likelihood of success of the procedure.  Following sterile skin prep and local anesthetic administration consisting of 1% lidocaine, a 5 inch 22 gauge needle was advanced into the thecal sac at the L3-4 level under fluoroscopic guidance. There was very sluggish return of CSF, even once we turned the patient into the left side down lateral decubitus position, probably attributable to the needle length.  Opening pressure was not directly measured but felt to be less than 10 cm of water based on the sluggishness of CSF collection.  Initial CSF was  blood-tinged which cleared somewhat over the course of the four vials.  A total of 10.5 ml of CSF was collected in four vials.  The needle was subsequently removed and the skin cleansed and bandaged.  No immediate complications observed.  IMPRESSION:  1.  Technically successful fluoroscopically guided lumbar puncture at the L3-4 level.  Initially blood tinged CSF partially cleared with subsequent tubes.   Original Report Authenticated By: Gaylyn Rong, M.D.     EKG: Independently reviewed.  Time spent: 80 min  Govind Furey M. [facility] Pager [phone]  If 7PM-7AM, please contact night-coverage [email or web address] Password [facility] 11/01/2012, 4:42 AM

## 2012-11-01 NOTE — ED Notes (Signed)
Patient back from x-ray; currently resting quietly in bed; no respiratory or acute distress noted.  Patient updated on plan of care; informed patient that we are currently waiting on lab results to come back.  Patient denies any needs at this time; will continue to monitor.

## 2012-11-01 NOTE — Consult Note (Signed)
Reason for Consult: Headache Referring Physician: Derwood Kaplan  CC: headache  History is obtained from:Patient, companions  HPI: Sergio Hatfield is a 22 y.o. male who began having headaches about 2 weeks ago and since that time has had fever. He had not had neck pain until today which he attributes to sleeping funny due to back pain from a recent LP. He presented yesterday and had an LP which was traumatic. It was felt that he likely was experiencing a viral syndrome and discharged home. His headache was not improving and therefore he presents for evaluation again tonight.    ROS: A 14 point ROS was performed and is negative except as noted in the HPI.  PMH: none  Family History: Father and Mother both with headaches concerning for migraine.   Social History: Tob: never smoker.   Exam: Current vital signs: BP 124/70  Pulse 54  Temp(Src) 98.2 F (36.8 C) (Oral)  Resp 16  SpO2 96% Vital signs in last 24 hours: Temp:  [98.2 F (36.8 C)-99.6 F (37.6 C)] 98.2 F (36.8 C) (02/10 1732) Pulse Rate:  [52-95] 54 (02/10 1732) Resp:  [16-20] 16 (02/10 1732) BP: (119-144)/(48-74) 124/70 mmHg (02/10 1732) SpO2:  [93 %-98 %] 96 % (02/10 1732)  General: in bed, NAD CV: RRR Mental Status: Patient is awake, alert, oriented to person, place, month, year, and situation. Immediate and remote memory are intact. Patient is able to give a clear and coherent history. No signs of aphasia or neglect.  Cranial Nerves: II: Visual Fields are full. Pupils are equal, round, and reactive to light.  Discs are sharp.  III,IV, VI: EOMI without ptosis or diploplia.  V: Facial sensation is symmetric to temperature VII: Facial movement is symmetric.  VIII: hearing is intact to voice X: Uvula elevates symmetrically XI: Shoulder shrug is symmetric. XII: tongue is midline without atrophy or fasciculations.  Motor: Tone is normal. Bulk is normal. 5/5 strength was present in all four  extremities.  Sensory: Sensation is symmetric to light touch and temperature in the arms and legs. Deep Tendon Reflexes: 2+ and symmetric in the biceps and patellae.  Cerebellar: FNF  intact bilaterally Gait: Patient has a stable casual gait.   I have reviewed labs in epic and the results pertinent to this consultation are: 8595 RBC in the first tube and 3640 in the 4th. The supernatant was clear. He had 28 WBC in the first tube and 26 in tube 4. His Diff was predominant PMNs in the first tube, but had decreased to almost even Lymphs and polys in the 4th tube. The pressure was not measured, but not felt to be elevated due to rate of return.   I have reviewed the images obtained:CT head - negative  Impression: 22 yo M with headaches and fever previously (none tonight). I susepct that his CSF does represent a mild lymphocytic pleocytosis with traumatic blood superimposed. He is taking PO and therefore I do not feel that admission for viral meningitis is necessary at this time. Given the length of symptoms and composition of CSF, I am not concerned for bacterial meningitis.   It is also possible, though I feel less likely, that his CSF findings are all post traumatic and this represents a viral syndrome without meningitis.   Recommendations: 1) Analgesics PRN headache.  2) No further workup needed at this time, though patient informed to return to ED for worsening headache, numbness, tweakness, or altered mental status.    Ritta Slot, MD  [facility] [phone]  If 7pm- 7am, please page neurology on call at [phone].

## 2012-11-01 NOTE — ED Notes (Signed)
States he was here yesterday for c/o headache x 2 weeks and had a spinal tap. States today his headache remains and site of spinal tap hurts so bad "he could hardly get out of bed." A&Ox4, resp e/u

## 2012-11-01 NOTE — ED Provider Notes (Addendum)
History     This chart was scribed for Sergio Kaplan, MD, MD by Smitty Pluck, ED Scribe. The patient was seen in room TR09C/TR09C and the patient's care was started at 6:22 PM.   CSN: [id number]  Arrival date & time 11/01/12  1724   None     Chief Complaint  Patient presents with  . Headache    (Consider location/radiation/quality/duration/timing/severity/associated sxs/prior treatment) The history is provided by the patient, a parent and medical records. No language interpreter was used.   Sergio Hatfield is a 22 y.o. male who presents to the Emergency Department complaining of constant, moderate headache onset 2 weeks ago and neck pain onset today. The headache is located behind his right eye. Pt was seen in ED 1 day ago due to same symptoms. He states that he had lumbar tap 2 weeks ago before pain started. Pt has taken percocet without relief. He reports that he has lower back pain onset today. He reports that the pain is aggravated by movement, walking and twisting. Pt denies neck stiffness, fever, visual disturbance, chills, nausea, vomiting, diarrhea, weakness, cough, SOB and any other pain. He denies recent travel. He denies hx of medical illness.    History reviewed. No pertinent past medical history.  History reviewed. No pertinent past surgical history.  History reviewed. No pertinent family history.  History  Substance Use Topics  . Smoking status: Never Smoker   . Smokeless tobacco: Not on file  . Alcohol Use: No      Review of Systems  Constitutional: Negative for fever and chills.  Respiratory: Negative for cough and shortness of breath.   Gastrointestinal: Negative for nausea, vomiting and diarrhea.  Neurological: Negative for weakness.  All other systems reviewed and are negative.    Allergies  Review of patient's allergies indicates no known allergies.  Home Medications   Current Outpatient Rx  Name  Route  Sig  Dispense  Refill  .  oxyCODONE-acetaminophen (PERCOCET/ROXICET) 5-325 MG per tablet   Oral   Take 1 tablet by mouth every 6 (six) hours as needed for pain.   10 tablet   0     BP 124/70  Pulse 54  Temp(Src) 98.2 F (36.8 C) (Oral)  Resp 16  SpO2 96%  Physical Exam  Nursing note and vitals reviewed. Constitutional: He is oriented to person, place, and time. He appears well-developed and well-nourished. No distress.  HENT:  Head: Normocephalic and atraumatic.  Eyes: EOM are normal.  Pupils are reactive and equal 3+  Neck: Neck supple. No tracheal deviation present.  No nuchal rigidity parapsinal cervical tenderness    Cardiovascular: Normal rate.   Pulmonary/Chest: Effort normal. No respiratory distress.  Musculoskeletal: Normal range of motion.  Diffuse lower back pain  Neurological: He is alert and oriented to person, place, and time.  Skin: Skin is warm and dry.  Psychiatric: He has a normal mood and affect. His behavior is normal.    ED Course  Procedures (including critical care time) DIAGNOSTIC STUDIES: Oxygen Saturation is 96% on room air, adequate by my interpretation.    COORDINATION OF CARE: 6:27 PM Discussed ED treatment with pt and pt agrees.     Labs Reviewed - No data to display Ct Head Wo Contrast  10/31/2012  *RADIOLOGY REPORT*  Clinical Data: Fever, headache for several days  CT HEAD WITHOUT CONTRAST  Technique:  Contiguous axial images were obtained from the base of the skull through the vertex without contrast.  Comparison: None.  Findings: The ventricular system is normal in size and configuration and the septum is in a normal midline position.  The fourth ventricle and basilar cisterns appear normal.  No hemorrhage, mass lesion, or acute infarction is seen.  On bone window images no calvarial abnormality is noted.  IMPRESSION: Negative unenhanced CT of the brain.   Original Report Authenticated By: Dwyane Dee, M.D.    Dg Lumbar Puncture Fluoro Guide  11/01/2012   *RADIOLOGY REPORT*  Clinical Data: Fever and headache.  LUMBAR PUNCTURE FLUORO GUIDE  Comparison: Head CT from 10/31/2012  Procedure:  We were requested to perform a lumbar puncture after failed attempts in the emergency department.  I discussed the risks (including hemorrhage and infection, among others), benefits, and alternatives to fluoroscopically guided lumbar puncture with the patient.  He understood and elected to undergo the procedure.  We discussed the high technical likelihood of success of the procedure.  Following sterile skin prep and local anesthetic administration consisting of 1% lidocaine, a 5 inch 22 gauge needle was advanced into the thecal sac at the L3-4 level under fluoroscopic guidance. There was very sluggish return of CSF, even once we turned the patient into the left side down lateral decubitus position, probably attributable to the needle length.  Opening pressure was not directly measured but felt to be less than 10 cm of water based on the sluggishness of CSF collection.  Initial CSF was blood-tinged which cleared somewhat over the course of the four vials.  A total of 10.5 ml of CSF was collected in four vials.  The needle was subsequently removed and the skin cleansed and bandaged.  No immediate complications observed.  IMPRESSION:  1.  Technically successful fluoroscopically guided lumbar puncture at the L3-4 level.  Initially blood tinged CSF partially cleared with subsequent tubes.   Original Report Authenticated By: Gaylyn Rong, M.D.      No diagnosis found.    MDM  I personally performed the services described in this documentation, which was scribed in my presence. The recorded information has been reviewed and is accurate.  Pt returns to the ED for a close f.u. In short, he has been having headaches for close to 2 weeks. Had LP yday which was deemed equivocal. hospitalist and ID consulted  -the latter recommended close f/u.  Pt comes in now stating he has  some neck and back pain. The headaches have been persistent.  Reviewing the CSF - there are some WBC, PMNs, but no organism in the gram stain, glucose is high normal, protein is normal.  Pt is denting any n/v/f/c/visual complains. Neuro exam is normail. There is no nuchal rigidity.  I am not convinced that this is meningitis either. Encephalitis?   I feel it will be best served to consult Neurology at this time, to see if they have any specific recommendations.    Sergio Kaplan, MD 11/01/12 1915  8:40 PM I was concerned that patient had meningitis vs. Complex headaches. Neurologist is leaning towards viral meningitis - although, with no lymphocyte prevalence, he cant rule out complex headaches either.  Will d/c with pain meds and f/u.  Sergio Kaplan, MD 11/01/12 2041

## 2012-11-02 NOTE — Procedures (Signed)
*  RADIOLOGY REPORT*  Clinical Data: Fever and headache.  LUMBAR PUNCTURE FLUORO GUIDE  Comparison: Head CT from 10/31/2012  Procedure: We were requested to perform a lumbar puncture after failed attempts in the emergency department.  I discussed the risks (including hemorrhage and infection, among others), benefits, and alternatives to fluoroscopically guided lumbar puncture with the patient. He understood and elected to undergo the procedure. We discussed the high technical likelihood of success of the procedure.  Following sterile skin prep and local anesthetic administration consisting of 1% lidocaine, a 5 inch 22 gauge needle was advanced into the thecal sac at the L3-4 level under fluoroscopic guidance. There was very sluggish return of CSF, even once we turned the patient into the left side down lateral decubitus position, probably attributable to the needle length. Opening pressure was not directly measured but felt to be less than 10 cm of water based on the sluggishness of CSF collection.  Initial CSF was blood-tinged which cleared somewhat over the course of the four vials. A total of 10.5 ml of CSF was collected in four vials.  The needle was subsequently removed and the skin cleansed and bandaged. No immediate complications observed.  IMPRESSION:  1. Technically successful fluoroscopically guided lumbar puncture at the L3-4 level. Initially blood tinged CSF partially cleared with subsequent tubes.

## 2012-11-02 NOTE — ED Provider Notes (Signed)
Medical screening examination/treatment/procedure(s) were conducted as a shared visit with non-physician practitioner(s) and myself.  I personally evaluated the patient during the encounter Patient presenting with fever, headache and mild neck stiffness. No other associated symptoms and is not toxic appearing. He is talking is done with no acute distress but does have a fever of 102. Labs are unrevealing an attempted LP however given patient's body habitus unable to do the LP in the emergency room if the needle is not long enough. Patient was sent to radiology to get an LP to rule out meningitis. Feel most likely it won't be viral meningitis as patient is not in extremis. Also HIB sent to ensure no concern for fungal infection.  Gwyneth Sprout, MD 11/02/12 [phone]

## 2012-11-02 NOTE — ED Notes (Signed)
Mother called for clarification of prescription : informed to alternate medication per Steward Ros PFM.

## 2012-11-05 LAB — CSF CULTURE W GRAM STAIN: Culture: NO GROWTH

## 2012-11-17 ENCOUNTER — Emergency Department (HOSPITAL_COMMUNITY): Payer: Self-pay

## 2012-11-17 ENCOUNTER — Emergency Department (HOSPITAL_COMMUNITY)
Admission: EM | Admit: 2012-11-17 | Discharge: 2012-11-18 | Disposition: A | Discharge status: Home / Self Care | Payer: Self-pay | Attending: Emergency Medicine | Admitting: Emergency Medicine

## 2012-11-17 ENCOUNTER — Encounter (HOSPITAL_COMMUNITY): Payer: Self-pay | Admitting: Emergency Medicine

## 2012-11-17 DIAGNOSIS — Z79899 Other long term (current) drug therapy: Secondary | ICD-10-CM | POA: Insufficient documentation

## 2012-11-17 DIAGNOSIS — R509 Fever, unspecified: Secondary | ICD-10-CM | POA: Insufficient documentation

## 2012-11-17 DIAGNOSIS — J029 Acute pharyngitis, unspecified: Secondary | ICD-10-CM | POA: Insufficient documentation

## 2012-11-17 LAB — BASIC METABOLIC PANEL
CO2: 25 mEq/L (ref 19–32)
Chloride: 101 mEq/L (ref 96–112)
Potassium: 3.8 mEq/L (ref 3.5–5.1)
Sodium: 138 mEq/L (ref 135–145)

## 2012-11-17 LAB — CBC WITH DIFFERENTIAL/PLATELET
Basophils Relative: 1 % (ref 0–1)
Eosinophils Relative: 0 % (ref 0–5)
Hemoglobin: 15.6 g/dL (ref 13.0–17.0)
Lymphocytes Relative: 26 % (ref 12–46)
MCH: 28.7 pg (ref 26.0–34.0)
Monocytes Absolute: 1.2 10*3/uL — ABNORMAL HIGH (ref 0.1–1.0)
Neutrophils Relative %: 59 % (ref 43–77)
RBC: 5.43 MIL/uL (ref 4.22–5.81)

## 2012-11-17 LAB — RAPID STREP SCREEN (MED CTR MEBANE ONLY): Streptococcus, Group A Screen (Direct): NEGATIVE

## 2012-11-17 MED ORDER — ACETAMINOPHEN 325 MG PO TABS
650.0000 mg | ORAL_TABLET | Freq: Once | ORAL | Status: DC
  Filled 2012-11-17: qty 2

## 2012-11-17 MED ORDER — FENTANYL CITRATE 0.05 MG/ML IJ SOLN
100.0000 ug | Freq: Once | INTRAMUSCULAR | Status: AC
  Administered 2012-11-18: 100 ug via INTRAVENOUS
  Filled 2012-11-17: qty 2

## 2012-11-17 MED ORDER — CLINDAMYCIN HCL 150 MG PO CAPS: 300.0000 mg | ORAL_CAPSULE | Freq: Three times a day (TID) | ORAL | Status: DC

## 2012-11-17 MED ORDER — CLINDAMYCIN PHOSPHATE 600 MG/50ML IV SOLN
600.0000 mg | Freq: Once | INTRAVENOUS | Status: AC
  Administered 2012-11-18: 600 mg via INTRAVENOUS
  Filled 2012-11-17: qty 50

## 2012-11-17 MED ORDER — DEXAMETHASONE SODIUM PHOSPHATE 10 MG/ML IJ SOLN
10.0000 mg | Freq: Once | INTRAMUSCULAR | Status: AC
  Administered 2012-11-18: 10 mg via INTRAVENOUS
  Filled 2012-11-17: qty 1

## 2012-11-17 MED ORDER — OXYCODONE-ACETAMINOPHEN 7.5-325 MG PO TABS: 1.0000 | ORAL_TABLET | ORAL | Status: DC | PRN

## 2012-11-17 NOTE — ED Notes (Signed)
Mother states about a month ago pt started running a fever  Was taken [facility] where he had a spinal tap to rule out meningitis  Pt was sent home and started feeling better for about a week then sxs restarted  Pt went back to [facility] was given IV medications and started feeling better now sxs have returned  Pt has not eaten or drank in 3 days  Pt has been running a fever again and is unable to get it to break  Pt has sore throat, fever, pain in his ears  Mother states pt has been sick off and on x 1 month

## 2012-11-17 NOTE — ED Notes (Addendum)
Pt took a sip of cold water and stated that his tongue felt like "someone pour salt on an open wound." Pt currently refusing tylenol.

## 2012-11-18 LAB — MONONUCLEOSIS SCREEN: Mono Screen: NEGATIVE

## 2012-11-18 NOTE — ED Notes (Signed)
Pt waiting for antibiotic infusion before discharge.

## 2012-11-23 NOTE — ED Provider Notes (Signed)
History     CSN: [id number]  Arrival date & time 11/17/12  2051   First MD Initiated Contact with Patient 11/17/12 2137      Chief Complaint  Patient presents with  . Otalgia  . Fever     HPI Mother states about a month ago pt started running a fever Was taken to [facility] where he had a spinal tap to rule out meningitis Pt was sent home and started feeling better for about a week then sxs restarted Pt went back to [facility] was given IV medications and started feeling better now sxs have returned Pt has not eaten or drank in 3 days Pt has been running a fever again and is unable to get it to break Pt has sore throat, fever, pain in his ears Mother states pt has been sick off and on x 1 month  History reviewed. No pertinent past medical history.  History reviewed. No pertinent past surgical history.  Family History  Problem Relation Age of Onset  . Hypertension Other   . Diabetes Other   . Heart failure Other     History  Substance Use Topics  . Smoking status: Never Smoker   . Smokeless tobacco: Not on file  . Alcohol Use: Yes     Comment: social       Review of Systems  All other systems reviewed and are negative.    Allergies  Review of patient's allergies indicates no known allergies.  Home Medications   Current Outpatient Rx  Name  Route  Sig  Dispense  Refill  . benzocaine (ORAJEL) 10 % mucosal gel   Mouth/Throat   Use as directed 1 application in the mouth or throat as needed for pain.         [name] Kitchen DM-Phenylephrine-Acetaminophen 10-5-325 MG TABS   Oral   Take 15 mLs by mouth 2 (two) times daily as needed (for cold symptoms and pain).         [name] Kitchen HYDROcodone-acetaminophen (NORCO/VICODIN) 5-325 MG per tablet   Oral   Take 1 tablet by mouth every 6 (six) hours as needed for pain.   20 tablet   0   . ibuprofen (ADVIL,MOTRIN) 600 MG tablet   Oral   Take 1 tablet (600 mg total) by mouth every 6 (six) hours as needed for pain.   30 tablet   0   . lip  balm (BLISTEX) OINT   Topical   Apply 1 application topically as needed (for dry lips).         . phenol (CHLORASEPTIC) 1.4 % LIQD   Mouth/Throat   Use as directed 1 spray in the mouth or throat as needed (for pain).         . Phenyleph-Doxyl-DM-Aspirin (ALKA-SELTZER PLUS DAY/NIGHT PO)   Oral   Take 1 tablet by mouth 2 (two) times daily as needed (for cold symptoms or pain).         . clindamycin (CLEOCIN) 150 MG capsule   Oral   Take 2 capsules (300 mg total) by mouth 3 (three) times daily.   28 capsule   0   . oxyCODONE-acetaminophen (PERCOCET) 7.5-325 MG per tablet   Oral   Take 1 tablet by mouth every 4 (four) hours as needed for pain.   15 tablet   0     BP 127/80  Pulse 97  Temp(Src) 99.4 F (37.4 C) (Oral)  Resp 18  SpO2 96%  Physical Exam  Nursing note and  vitals reviewed. Constitutional: He is oriented to person, place, and time. He appears well-developed and well-nourished. No distress.  HENT:  Head: Normocephalic and atraumatic.  Mouth/Throat: Oropharyngeal exudate, posterior oropharyngeal edema and posterior oropharyngeal erythema present. No tonsillar abscesses.  Eyes: Pupils are equal, round, and reactive to light.  Neck: Normal range of motion. No Brudzinski's sign and no Kernig's sign noted.  Cardiovascular: Normal rate and intact distal pulses.   Pulmonary/Chest: No respiratory distress.  Abdominal: Normal appearance. He exhibits no distension.  Musculoskeletal: Normal range of motion.  Neurological: He is alert and oriented to person, place, and time. No cranial nerve deficit.  Skin: Skin is warm and dry. No rash noted.  Psychiatric: He has a normal mood and affect. His behavior is normal.    ED Course  Procedures (including critical care time)  Labs Reviewed  CBC WITH DIFFERENTIAL - Abnormal; Notable for the following:    Monocytes Relative 14 (*)    Monocytes Absolute 1.2 (*)    All other components within normal limits  RAPID STREP  SCREEN  BASIC METABOLIC PANEL  MONONUCLEOSIS SCREEN   No results found.   1. Exudative pharyngitis   2. Fever       MDM  Contacted ENT.  Will see in office tomorrow.       Nelia Shi, MD 11/23/12 [phone]

## 2013-07-11 ENCOUNTER — Telehealth: Payer: Self-pay

## 2013-07-11 NOTE — Telephone Encounter (Signed)
Patient contacted regarding new intake appointment. Date and time given. Information given regarding documents needed to qualify for financial eligibility.  Jezel Basto K Urie Loughner, RN  

## 2013-07-26 ENCOUNTER — Ambulatory Visit (INDEPENDENT_AMBULATORY_CARE_PROVIDER_SITE_OTHER): Payer: Self-pay

## 2013-07-26 DIAGNOSIS — B20 Human immunodeficiency virus [HIV] disease: Secondary | ICD-10-CM

## 2013-07-26 DIAGNOSIS — Z113 Encounter for screening for infections with a predominantly sexual mode of transmission: Secondary | ICD-10-CM

## 2013-07-26 DIAGNOSIS — Z23 Encounter for immunization: Secondary | ICD-10-CM

## 2013-07-26 DIAGNOSIS — Z79899 Other long term (current) drug therapy: Secondary | ICD-10-CM

## 2013-07-26 DIAGNOSIS — L818 Other specified disorders of pigmentation: Secondary | ICD-10-CM

## 2013-07-26 LAB — COMPLETE METABOLIC PANEL WITH GFR
ALT: 36 U/L (ref 0–53)
Albumin: 4 g/dL (ref 3.5–5.2)
Alkaline Phosphatase: 50 U/L (ref 39–117)
CO2: 30 mEq/L (ref 19–32)
GFR, Est African American: >89 mL/min
Potassium: 3.9 mEq/L (ref 3.5–5.3)
Sodium: 137 mEq/L (ref 135–145)
Total Bilirubin: 0.5 mg/dL (ref 0.3–1.2)
Total Protein: 8.4 g/dL — ABNORMAL HIGH (ref 6.0–8.3)

## 2013-07-26 LAB — CBC WITH DIFFERENTIAL/PLATELET
Eosinophils Absolute: 0 10*3/uL (ref 0.0–0.7)
Lymphs Abs: 1.3 10*3/uL (ref 0.7–4.0)
MCH: 29.4 pg (ref 26.0–34.0)
Neutro Abs: 3.9 10*3/uL (ref 1.7–7.7)
Neutrophils Relative %: 69 % (ref 43–77)
Platelets: 273 10*3/uL (ref 150–400)
RBC: 5.31 MIL/uL (ref 4.22–5.81)
WBC: 5.7 10*3/uL (ref 4.0–10.5)

## 2013-07-27 LAB — URINALYSIS
Hgb urine dipstick: NEGATIVE
Leukocytes, UA: NEGATIVE
Nitrite: NEGATIVE
Protein, ur: 100 mg/dL — AB
Urobilinogen, UA: 0.2 mg/dL (ref 0.0–1.0)

## 2013-07-27 LAB — HIV-1 RNA ULTRAQUANT REFLEX TO GENTYP+
HIV 1 RNA Quant: 27166 copies/mL — ABNORMAL HIGH (ref <20)
HIV-1 RNA Quant, Log: 4.43 {Log} — ABNORMAL HIGH (ref <1.30)

## 2013-07-27 LAB — HEPATITIS A ANTIBODY, TOTAL: Hep A Total Ab: REACTIVE — AB

## 2013-07-27 LAB — HEPATITIS B CORE ANTIBODY, TOTAL: Hep B Core Total Ab: NONREACTIVE

## 2013-07-27 LAB — HEPATITIS B SURFACE ANTIBODY,QUALITATIVE: Hep B S Ab: NEGATIVE

## 2013-07-27 LAB — T-HELPER CELL (CD4) - (RCID CLINIC ONLY): CD4 T Cell Abs: 210 /uL — ABNORMAL LOW (ref 400–2700)

## 2013-07-28 ENCOUNTER — Telehealth: Payer: Self-pay

## 2013-07-28 LAB — TB SKIN TEST
Induration: 0 mm
TB Skin Test: NEGATIVE

## 2013-07-28 NOTE — Telephone Encounter (Signed)
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  He will need to come for three weeks of treatment once weekly on the same day of the week.  Pt has opted to come on Friday, August 08, 2013 @ noon.    Laurell Josephs, RN

## 2013-07-29 DIAGNOSIS — L818 Other specified disorders of pigmentation: Secondary | ICD-10-CM | POA: Insufficient documentation

## 2013-07-29 DIAGNOSIS — B20 Human immunodeficiency virus [HIV] disease: Secondary | ICD-10-CM | POA: Insufficient documentation

## 2013-07-29 NOTE — Progress Notes (Signed)
Patient went in for testing due to his partner testing positive for HIV. He has been asymptomatic and was surprised by diagnosis . He has a previous positive RPR with [facility] on 06-21-13 and was treated with doxycycline for 14 days on 06-27-13.  RPR titer was 1:64. Past history of positive gonorrhea and chlamydia infections treated by [facility].  Records of treatment received.  Vaccines updated.   Laurell Josephs, RN

## 2013-08-03 LAB — HIV-1 GENOTYPR PLUS

## 2013-08-09 ENCOUNTER — Other Ambulatory Visit: Payer: Self-pay | Admitting: Licensed Clinical Social Worker

## 2013-08-09 ENCOUNTER — Ambulatory Visit (INDEPENDENT_AMBULATORY_CARE_PROVIDER_SITE_OTHER): Payer: Self-pay | Admitting: Internal Medicine

## 2013-08-09 ENCOUNTER — Encounter: Payer: Self-pay | Admitting: Internal Medicine

## 2013-08-09 VITALS — BP 147/99 | HR 118 | Temp 98.3°F | Ht 66.0 in | Wt 136.0 lb

## 2013-08-09 DIAGNOSIS — Z23 Encounter for immunization: Secondary | ICD-10-CM

## 2013-08-09 DIAGNOSIS — B20 Human immunodeficiency virus [HIV] disease: Secondary | ICD-10-CM

## 2013-08-09 MED ORDER — ELVITEG-COBIC-EMTRICIT-TENOFDF 150-150-200-300 MG PO TABS: 1.0000 | ORAL_TABLET | Freq: Every day | ORAL | Status: DC

## 2013-08-09 NOTE — Addendum Note (Signed)
Addended by: Starleen Arms D on: 08/09/2013 03:49 PM   Modules accepted: Orders

## 2013-08-09 NOTE — Assessment & Plan Note (Signed)
Discussed at length the benefit of treatment, the treatment treatment options the long-term prognosis. I discussed the mechanism of resistance. After this discussion we had decided to start Stribild. He will get this through the drug assistance program once it is available. He has completed his paperwork. I will have him return in one month to assure that he got the medication and scheduled an follow up lab appointment depending on how long he's been on it. He understands that he can call at anytime if there is any problems with getting the medication or other issues.

## 2013-08-09 NOTE — Progress Notes (Signed)
  Subjective:    Patient ID: Patrick Dunlap, male    DOB: October 30, 1990, 22 y.o.   MRN: [id number]  HPI He comes in here as a new patient with HIV. He was diagnosed recently by the health department where he also had syphilis and chlamydia. He was treated for those although he also did a penicillin injection here. He is here to stab his care and is interested in therapy. He did undergo intake. He has no particular complaints including no weight loss, no diarrhea. He has had some hives that have come and go they're relieved with Benadryl. He is still absorbing the diagnosis but is accepting and ready for treatment. His CD4 count is 210. He has no particular questions and no concerns with starting therapy. He understands that condoms are needed.   Review of Systems  Constitutional: Negative for fever, appetite change and fatigue.  HENT: Negative for sore throat and trouble swallowing.   Eyes: Negative for visual disturbance.  Respiratory: Negative for cough and shortness of breath.   Gastrointestinal: Negative for nausea and diarrhea.  Genitourinary: Negative for discharge.  Musculoskeletal: Negative for back pain.  Skin: Negative for rash.  Neurological: Negative for dizziness, light-headedness and headaches.  Hematological: Negative for adenopathy.  Psychiatric/Behavioral: Negative for dysphoric mood. The patient is not nervous/anxious.        Objective:   Physical Exam  Constitutional: He is oriented to person, place, and time. He appears well-developed and well-nourished. No distress.  HENT:  Mouth/Throat: No oropharyngeal exudate.  Eyes: Right eye exhibits no discharge. Left eye exhibits no discharge. No scleral icterus.  Cardiovascular: Normal rate, regular rhythm and normal heart sounds.   No murmur heard. Pulmonary/Chest: Effort normal and breath sounds normal. No respiratory distress. He has no wheezes.  Abdominal: Soft. Bowel sounds are normal. He exhibits no distension. There is  no tenderness.  Lymphadenopathy:    He has no cervical adenopathy.  Neurological: He is alert and oriented to person, place, and time.  Skin: Skin is warm and dry. No rash noted.  Psychiatric: He has a normal mood and affect. His behavior is normal.          Assessment & Plan:

## 2013-09-05 ENCOUNTER — Other Ambulatory Visit: Payer: Self-pay | Admitting: *Deleted

## 2013-09-05 DIAGNOSIS — B20 Human immunodeficiency virus [HIV] disease: Secondary | ICD-10-CM

## 2013-09-05 MED ORDER — ELVITEG-COBIC-EMTRICIT-TENOFDF 150-150-200-300 MG PO TABS: 1.0000 | ORAL_TABLET | Freq: Every day | ORAL | Status: DC

## 2013-09-08 ENCOUNTER — Encounter: Payer: Self-pay | Admitting: Internal Medicine

## 2013-09-08 ENCOUNTER — Ambulatory Visit (INDEPENDENT_AMBULATORY_CARE_PROVIDER_SITE_OTHER): Payer: Self-pay | Admitting: Internal Medicine

## 2013-09-08 VITALS — BP 167/108 | HR 99 | Temp 98.2°F | Ht 66.0 in | Wt 133.0 lb

## 2013-09-08 DIAGNOSIS — Z23 Encounter for immunization: Secondary | ICD-10-CM

## 2013-09-08 DIAGNOSIS — B20 Human immunodeficiency virus [HIV] disease: Secondary | ICD-10-CM

## 2013-09-08 NOTE — Assessment & Plan Note (Signed)
I again discussed the side effects and need for monitoring with blood tests. He is going to come back in 3 weeks for labs and I will see him in 4 weeks. He knows to take with food to avoid nausea. All questions answered.

## 2013-09-08 NOTE — Progress Notes (Signed)
  Subjective:    Patient ID: Patrick Dunlap, male    DOB: [birth year], 22 y.o.   MRN: [id number]  HPI  He comes in for his second visit with HIV. He was diagnosed recently by the health department where he also had syphilis and chlamydia. He was treated for those although he also did a penicillin injection here. He is here to stab his care and is interested in therapy. He is ready for treatment and his drug assistance program has been activated. He is going to go after this appointment pickup his medications. His CD4 count is 210. He has no particular questions and no concerns with starting therapy. He understands that condoms are needed.   Review of Systems  Constitutional: Negative for fever, appetite change and fatigue.  HENT: Negative for sore throat and trouble swallowing.   Eyes: Negative for visual disturbance.  Respiratory: Negative for cough and shortness of breath.   Gastrointestinal: Negative for nausea and diarrhea.  Genitourinary: Negative for discharge.  Musculoskeletal: Negative for back pain.  Skin: Negative for rash.  Neurological: Negative for dizziness, light-headedness and headaches.  Hematological: Negative for adenopathy.  Psychiatric/Behavioral: Negative for dysphoric mood. The patient is not nervous/anxious.        Objective:   Physical Exam  Constitutional: He is oriented to person, place, and time. He appears well-developed and well-nourished. No distress.  HENT:  Mouth/Throat: No oropharyngeal exudate.  Eyes: Right eye exhibits no discharge. Left eye exhibits no discharge. No scleral icterus.  Cardiovascular: Normal rate, regular rhythm and normal heart sounds.   No murmur heard. Pulmonary/Chest: Effort normal and breath sounds normal. No respiratory distress. He has no wheezes.  Abdominal: Soft. Bowel sounds are normal. He exhibits no distension. There is no tenderness.  Lymphadenopathy:    He has no cervical adenopathy.  Neurological: He is alert and  oriented to person, place, and time.  Skin: Skin is warm and dry. No rash noted.  Psychiatric: He has a normal mood and affect. His behavior is normal.          Assessment & Plan:

## 2013-09-30 ENCOUNTER — Other Ambulatory Visit: Payer: Self-pay

## 2013-09-30 ENCOUNTER — Ambulatory Visit: Payer: Self-pay

## 2013-10-14 ENCOUNTER — Ambulatory Visit (INDEPENDENT_AMBULATORY_CARE_PROVIDER_SITE_OTHER): Payer: Self-pay | Admitting: Internal Medicine

## 2013-10-14 ENCOUNTER — Encounter: Payer: Self-pay | Admitting: Internal Medicine

## 2013-10-14 VITALS — BP 140/101 | HR 85 | Temp 98.5°F | Ht 66.0 in | Wt 137.0 lb

## 2013-10-14 DIAGNOSIS — B20 Human immunodeficiency virus [HIV] disease: Secondary | ICD-10-CM

## 2013-10-14 LAB — CBC WITH DIFFERENTIAL/PLATELET
BASOS ABS: 0 10*3/uL (ref 0.0–0.1)
Basophils Relative: 0 % (ref 0–1)
EOS ABS: 0.1 10*3/uL (ref 0.0–0.7)
EOS PCT: 1 % (ref 0–5)
HEMATOCRIT: 44.6 % (ref 39.0–52.0)
Hemoglobin: 15.2 g/dL (ref 13.0–17.0)
LYMPHS ABS: 1.4 10*3/uL (ref 0.7–4.0)
LYMPHS PCT: 21 % (ref 12–46)
MCH: 29.2 pg (ref 26.0–34.0)
MCHC: 34.1 g/dL (ref 30.0–36.0)
MCV: 85.8 fL (ref 78.0–100.0)
MONO ABS: 0.5 10*3/uL (ref 0.1–1.0)
Monocytes Relative: 8 % (ref 3–12)
Neutro Abs: 4.5 10*3/uL (ref 1.7–7.7)
Neutrophils Relative %: 70 % (ref 43–77)
PLATELETS: 277 10*3/uL (ref 150–400)
RBC: 5.2 MIL/uL (ref 4.22–5.81)
RDW: 14.6 % (ref 11.5–15.5)
WBC: 6.5 10*3/uL (ref 4.0–10.5)

## 2013-10-14 LAB — COMPLETE METABOLIC PANEL WITH GFR
ALT: 19 U/L (ref 0–53)
AST: 31 U/L (ref 0–37)
Albumin: 4 g/dL (ref 3.5–5.2)
Alkaline Phosphatase: 62 U/L (ref 39–117)
BILIRUBIN TOTAL: 0.4 mg/dL (ref 0.3–1.2)
BUN: 15 mg/dL (ref 6–23)
CALCIUM: 9.8 mg/dL (ref 8.4–10.5)
CHLORIDE: 101 meq/L (ref 96–112)
CO2: 30 meq/L (ref 19–32)
CREATININE: 1.05 mg/dL (ref 0.50–1.35)
GFR, Est Non African American: >89 mL/min
Glucose, Bld: 82 mg/dL (ref 70–99)
Potassium: 4.1 mEq/L (ref 3.5–5.3)
Sodium: 139 mEq/L (ref 135–145)
Total Protein: 8 g/dL (ref 6.0–8.3)

## 2013-10-14 LAB — T-HELPER CELL (CD4) - (RCID CLINIC ONLY)
CD4 T CELL ABS: 280 /uL — AB (ref 400–2700)
CD4 T CELL HELPER: 18 % — AB (ref 33–55)

## 2013-10-14 NOTE — Assessment & Plan Note (Signed)
I will check his labs today.  If all ok, RTC in 3 months, he will be called for any issues.

## 2013-10-14 NOTE — Progress Notes (Signed)
  Subjective:    Patient ID: Patrick Dunlap, male    DOB: 06/17/1991, 23 y.o.   MRN: [id number]  HPI  He comes in for follow up of HIV. He was diagnosed last year by the health department where he also had syphilis and chlamydia. He was treated for those although he also did a penicillin injection here. He is ready for treatment and his drug assistance program has been activated. He started his medications over one month ago and feels well.  Is pleased with the regimen.  Some mild nausea with Stribild when he takes it without food but no concerns with it.  No weight loss, no diarrhea.    Review of Systems  Constitutional: Negative for fever, appetite change and fatigue.  HENT: Negative for sore throat and trouble swallowing.   Eyes: Negative for visual disturbance.  Respiratory: Negative for cough and shortness of breath.   Gastrointestinal: Negative for nausea and diarrhea.  Genitourinary: Negative for discharge.  Musculoskeletal: Negative for back pain.  Skin: Negative for rash.  Neurological: Negative for dizziness, light-headedness and headaches.  Hematological: Negative for adenopathy.  Psychiatric/Behavioral: Negative for dysphoric mood. The patient is not nervous/anxious.        Objective:   Physical Exam  Constitutional: He is oriented to person, place, and time. He appears well-developed and well-nourished. No distress.  HENT:  Mouth/Throat: No oropharyngeal exudate.  Eyes: Right eye exhibits no discharge. Left eye exhibits no discharge. No scleral icterus.  Cardiovascular: Normal rate, regular rhythm and normal heart sounds.   No murmur heard. Pulmonary/Chest: Effort normal and breath sounds normal. No respiratory distress. He has no wheezes.  Abdominal: Soft. Bowel sounds are normal. He exhibits no distension. There is no tenderness.  Lymphadenopathy:    He has no cervical adenopathy.  Neurological: He is alert and oriented to person, place, and time.  Skin: Skin is  warm and dry. No rash noted.  Psychiatric: He has a normal mood and affect. His behavior is normal.          Assessment & Plan:   

## 2013-10-17 LAB — HIV-1 RNA QUANT-NO REFLEX-BLD
HIV 1 RNA Quant: 37 copies/mL — ABNORMAL HIGH (ref <20)
HIV-1 RNA Quant, Log: 1.57 {Log} — ABNORMAL HIGH (ref <1.30)

## 2013-10-20 ENCOUNTER — Ambulatory Visit: Payer: Self-pay

## 2013-10-20 ENCOUNTER — Ambulatory Visit: Payer: Self-pay | Admitting: Internal Medicine

## 2013-12-02 ENCOUNTER — Other Ambulatory Visit: Payer: Self-pay | Admitting: Internal Medicine

## 2013-12-09 ENCOUNTER — Ambulatory Visit: Payer: Self-pay

## 2014-01-05 ENCOUNTER — Other Ambulatory Visit: Payer: Self-pay | Admitting: Internal Medicine

## 2014-01-16 ENCOUNTER — Ambulatory Visit: Payer: Self-pay

## 2014-01-16 ENCOUNTER — Encounter: Payer: Self-pay | Admitting: Internal Medicine

## 2014-01-16 ENCOUNTER — Ambulatory Visit (INDEPENDENT_AMBULATORY_CARE_PROVIDER_SITE_OTHER): Payer: Self-pay | Admitting: Internal Medicine

## 2014-01-16 VITALS — BP 157/107 | HR 106 | Temp 98.9°F | Wt 131.0 lb

## 2014-01-16 DIAGNOSIS — I10 Essential (primary) hypertension: Secondary | ICD-10-CM | POA: Insufficient documentation

## 2014-01-16 DIAGNOSIS — Z23 Encounter for immunization: Secondary | ICD-10-CM

## 2014-01-16 DIAGNOSIS — B20 Human immunodeficiency virus [HIV] disease: Secondary | ICD-10-CM

## 2014-01-16 LAB — CBC WITH DIFFERENTIAL/PLATELET
BASOS ABS: 0 10*3/uL (ref 0.0–0.1)
Basophils Relative: 0 % (ref 0–1)
Eosinophils Absolute: 0 10*3/uL (ref 0.0–0.7)
Eosinophils Relative: 0 % (ref 0–5)
HEMATOCRIT: 42.2 % (ref 39.0–52.0)
HEMOGLOBIN: 14.7 g/dL (ref 13.0–17.0)
LYMPHS ABS: 2.1 10*3/uL (ref 0.7–4.0)
LYMPHS PCT: 28 % (ref 12–46)
MCH: 29.3 pg (ref 26.0–34.0)
MCHC: 34.8 g/dL (ref 30.0–36.0)
MCV: 84.2 fL (ref 78.0–100.0)
MONO ABS: 0.4 10*3/uL (ref 0.1–1.0)
Monocytes Relative: 5 % (ref 3–12)
NEUTROS ABS: 5.1 10*3/uL (ref 1.7–7.7)
Neutrophils Relative %: 67 % (ref 43–77)
Platelets: 311 10*3/uL (ref 150–400)
RBC: 5.01 MIL/uL (ref 4.22–5.81)
RDW: 13.8 % (ref 11.5–15.5)
WBC: 7.6 10*3/uL (ref 4.0–10.5)

## 2014-01-16 NOTE — Addendum Note (Signed)
Addended by: Starleen ArmsYARBOROUGH, TAMIKA D on: 01/16/2014 02:47 PM   Modules accepted: Orders

## 2014-01-16 NOTE — Progress Notes (Signed)
  Subjective:    Patient ID: Patrick Dunlap, male    DOB: 1991/03/15, 23 y.o.   MRN: [id number]  HPI  He comes in for follow up of HIV. He was diagnosed last year by the health department where he also had syphilis and chlamydia. He was treated for those although he also did a penicillin injection here. He is ready for treatment and his drug assistance program has been activated. He started his medications over one month ago and feels well.  Is pleased with the regimen.  Some mild nausea with Stribild when he takes it without food but no concerns with it.  No weight loss, no diarrhea.    Review of Systems  Constitutional: Negative for fever, appetite change and fatigue.  HENT: Negative for sore throat and trouble swallowing.   Eyes: Negative for visual disturbance.  Respiratory: Negative for cough and shortness of breath.   Gastrointestinal: Negative for nausea and diarrhea.  Genitourinary: Negative for discharge.  Musculoskeletal: Negative for back pain.  Skin: Negative for rash.  Neurological: Negative for dizziness, light-headedness and headaches.  Hematological: Negative for adenopathy.  Psychiatric/Behavioral: Negative for dysphoric mood. The patient is not nervous/anxious.        Objective:   Physical Exam  Constitutional: He is oriented to person, place, and time. He appears well-developed and well-nourished. No distress.  HENT:  Mouth/Throat: No oropharyngeal exudate.  Eyes: Right eye exhibits no discharge. Left eye exhibits no discharge. No scleral icterus.  Cardiovascular: Normal rate, regular rhythm and normal heart sounds.   No murmur heard. Pulmonary/Chest: Effort normal and breath sounds normal. No respiratory distress. He has no wheezes.  Abdominal: Soft. Bowel sounds are normal. He exhibits no distension. There is no tenderness.  Lymphadenopathy:    He has no cervical adenopathy.  Neurological: He is alert and oriented to person, place, and time.  Skin: Skin is  warm and dry. No rash noted.  Psychiatric: He has a normal mood and affect. His behavior is normal.          Assessment & Plan:

## 2014-01-16 NOTE — Assessment & Plan Note (Signed)
Remains high here, he is going to check BP at pharmacy. If remains elevated, he will call.

## 2014-01-16 NOTE — Assessment & Plan Note (Addendum)
Doing well, some mild nausea but eats 30 min before meds.  Rare loose stools but no significant changes in weight.  It does fluctuate between 131 and 137 here.  Will continue to watch.  RTC 3 months.

## 2014-01-17 ENCOUNTER — Other Ambulatory Visit: Payer: Self-pay | Admitting: *Deleted

## 2014-01-17 LAB — T-HELPER CELL (CD4) - (RCID CLINIC ONLY)
CD4 T CELL HELPER: 19 % — AB (ref 33–55)
CD4 T Cell Abs: 440 /uL (ref 400–2700)

## 2014-01-17 LAB — HIV-1 RNA QUANT-NO REFLEX-BLD: HIV-1 RNA Quant, Log: <1.3 {Log} (ref <1.30)

## 2014-01-17 LAB — COMPLETE METABOLIC PANEL WITH GFR
ALK PHOS: 62 U/L (ref 39–117)
ALT: 17 U/L (ref 0–53)
AST: 28 U/L (ref 0–37)
Albumin: 4.5 g/dL (ref 3.5–5.2)
BILIRUBIN TOTAL: 0.4 mg/dL (ref 0.2–1.2)
BUN: 14 mg/dL (ref 6–23)
CO2: 26 mEq/L (ref 19–32)
CREATININE: 1.09 mg/dL (ref 0.50–1.35)
Calcium: 9.4 mg/dL (ref 8.4–10.5)
Chloride: 104 mEq/L (ref 96–112)
GFR, Est African American: >89 mL/min
GLUCOSE: 83 mg/dL (ref 70–99)
Potassium: 3.7 mEq/L (ref 3.5–5.3)
Sodium: 143 mEq/L (ref 135–145)
Total Protein: 7.3 g/dL (ref 6.0–8.3)

## 2014-01-17 MED ORDER — ELVITEG-COBIC-EMTRICIT-TENOFDF 150-150-200-300 MG PO TABS: ORAL_TABLET | ORAL | Status: DC

## 2014-01-18 ENCOUNTER — Encounter: Payer: Self-pay | Admitting: *Deleted

## 2014-03-14 IMAGING — CT CT HEAD W/O CM
1 of 2 series · 13 of 30 positions shown, 17 images · non-contrast
Comparison: None.

CLINICAL DATA: Fever, headache for several days

CT HEAD WITHOUT CONTRAST
TECHNIQUE: Contiguous axial images were obtained from the base of
the skull through the vertex without contrast.

[Series 2: brain · axial · 0.47mm/px · z∈[+95,+228]mm · 13 of 32 slices shown, 17 images]
[im 3/32  brain]
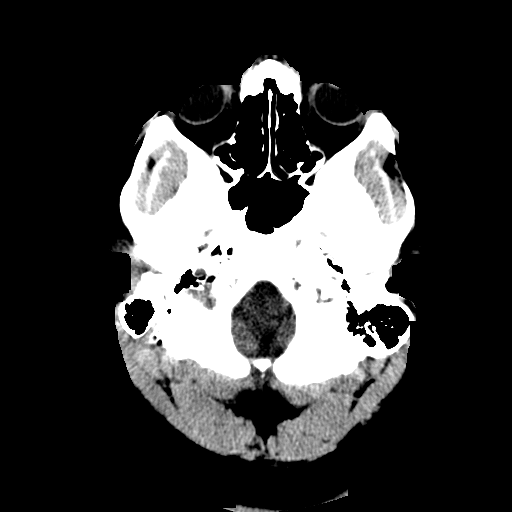
[im 3/32  bone]
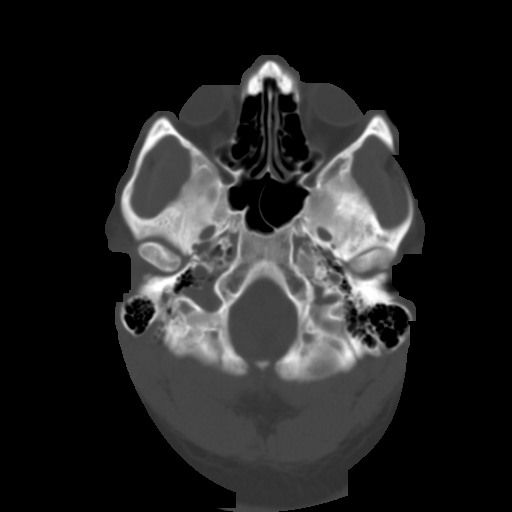
[im 5/32  brain]
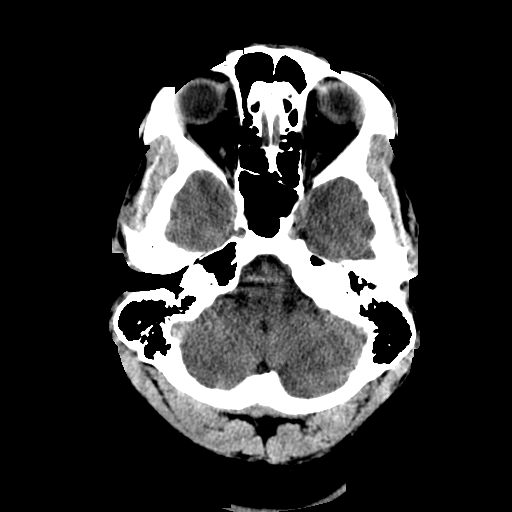
[im 7/32  brain]
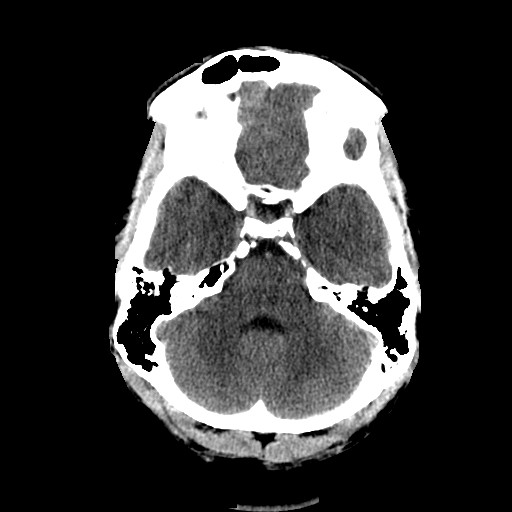
[im 9/32  brain]
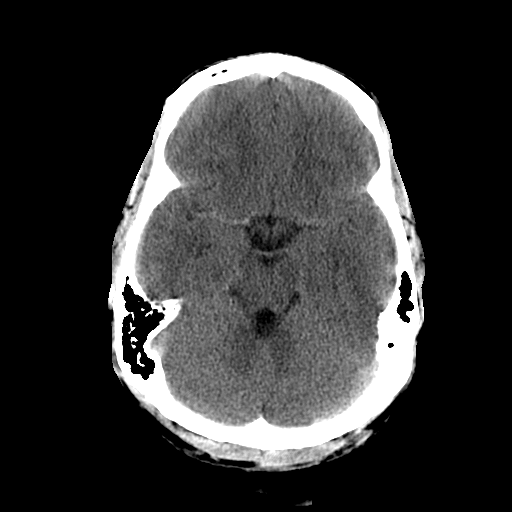
[im 12/32  brain]
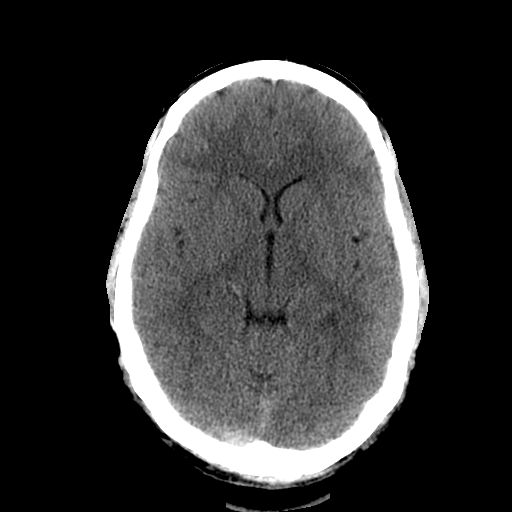
[im 12/32  bone]
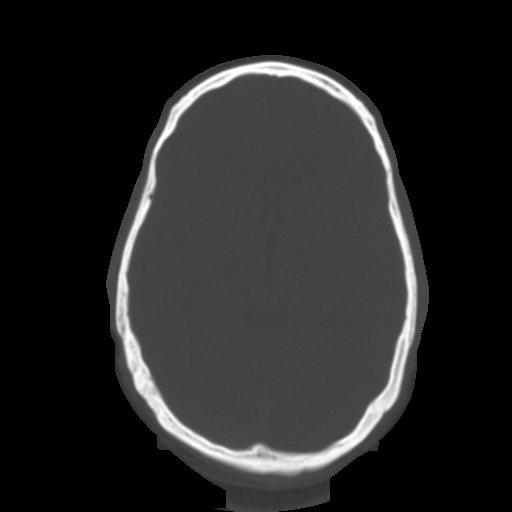
[im 14/32  brain]
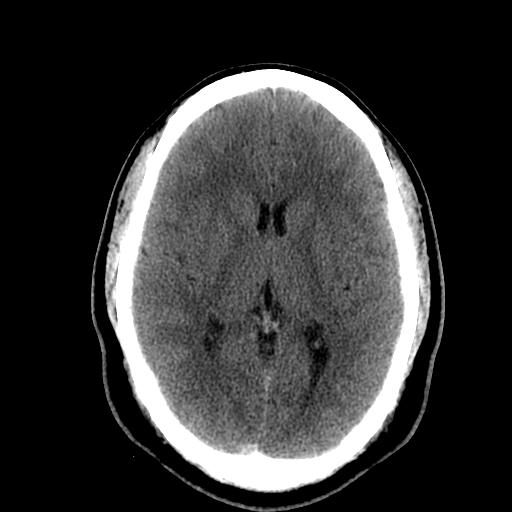
[im 16/32  brain]
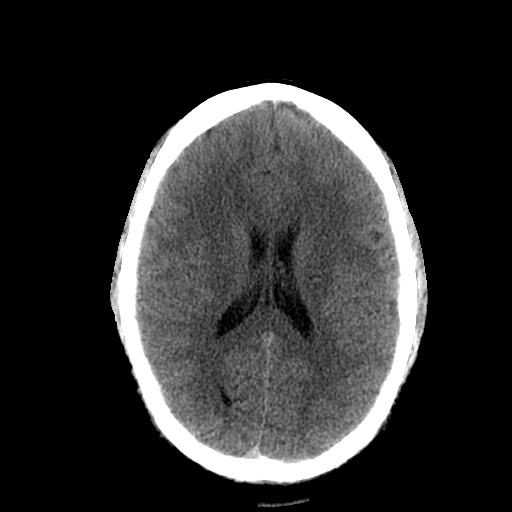
[im 18/32  brain]
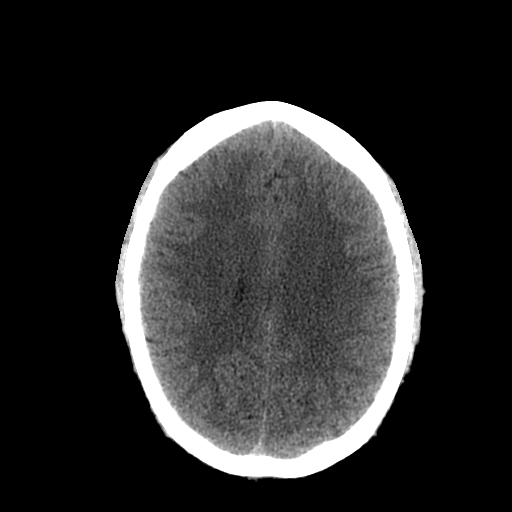
[im 20/32  brain]
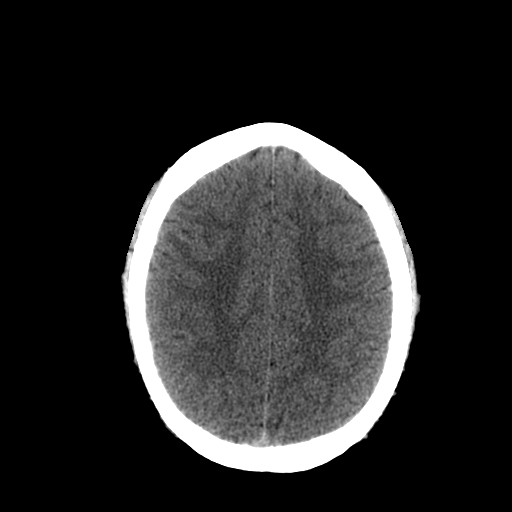
[im 20/32  bone]
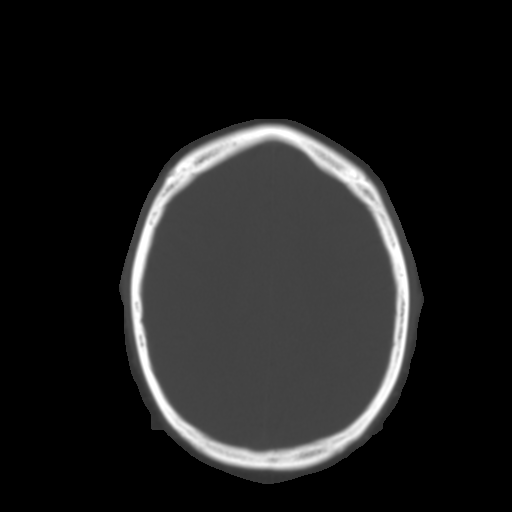
[im 23/32  brain]
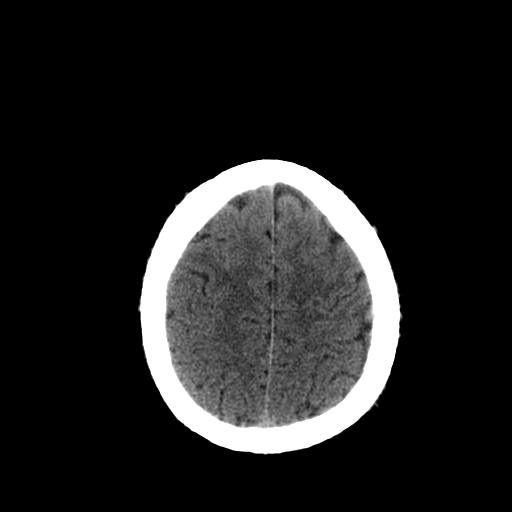
[im 25/32  brain]
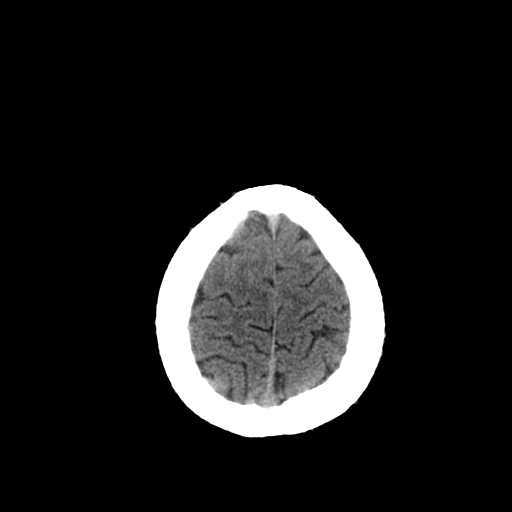
[im 27/32  brain]
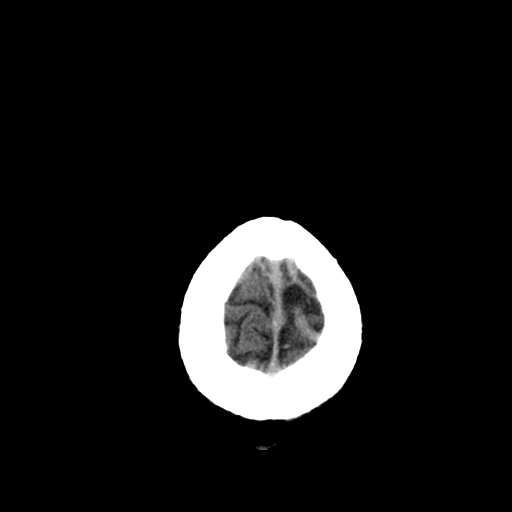
[im 29/32  brain]
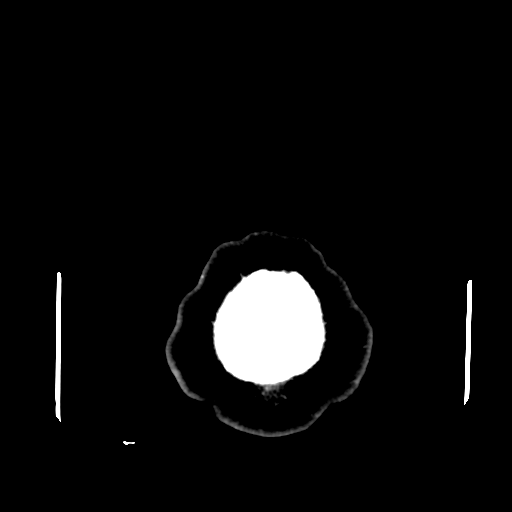
[im 29/32  bone]
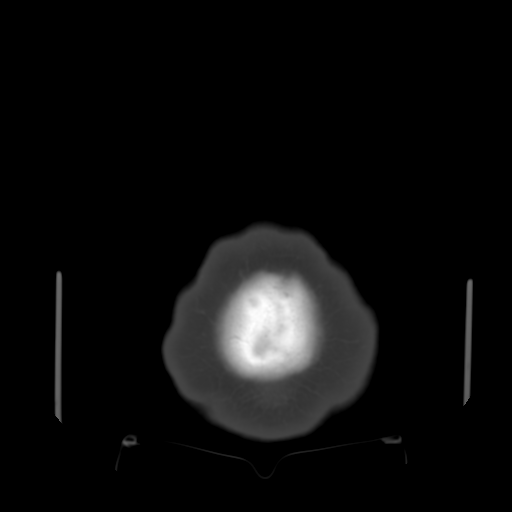

[13 of 30 positions shown; findings below may reference images not displayed]

FINDINGS: The ventricular system is normal in size and
configuration and the septum is in a normal midline position.  The
fourth ventricle and basilar cisterns appear normal.  No
hemorrhage, mass lesion, or acute infarction is seen.  On bone
window images no calvarial abnormality is noted.
IMPRESSION: Negative unenhanced CT of the brain.

## 2014-03-31 IMAGING — CT CT MAXILLOFACIAL W/O CM
1 series · 16 of 30 positions shown, 20 images · non-contrast
Comparison: None

CLINICAL DATA: 21-year-old male with ear pain, fever, and sore
throat.

CT MAXILLOFACIAL WITHOUT CONTRAST
TECHNIQUE: Multidetector CT imaging of the maxillofacial
structures was performed. Multiplanar CT image reconstructions were
also generated.

[Series 2: facial st · axial · 0.34mm/px · z∈[+1371,+1521]mm · 16 of 81 slices shown, 20 images]
[im 3/81  brain]
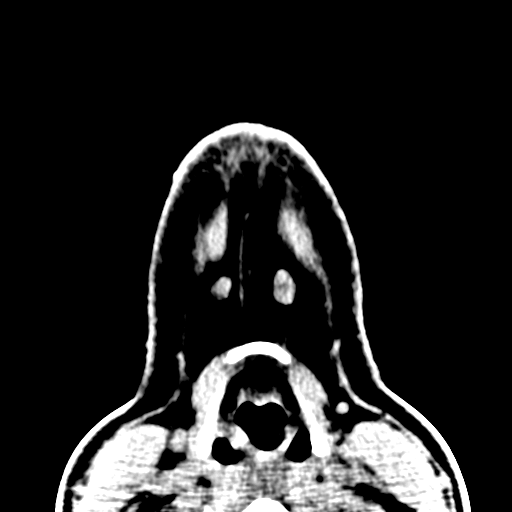
[im 3/81  bone]
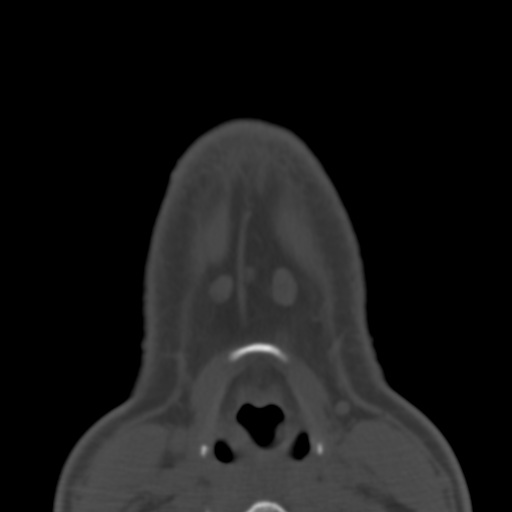
[im 9/81  bone]
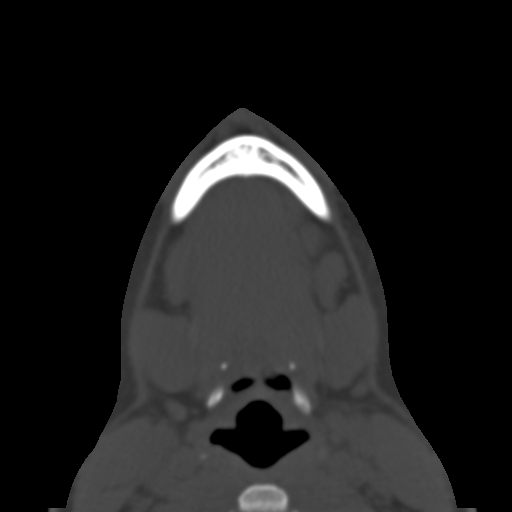
[im 14/81  bone]
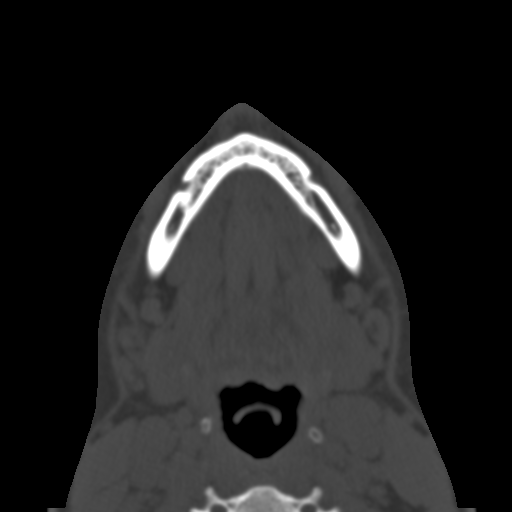
[im 20/81  bone]
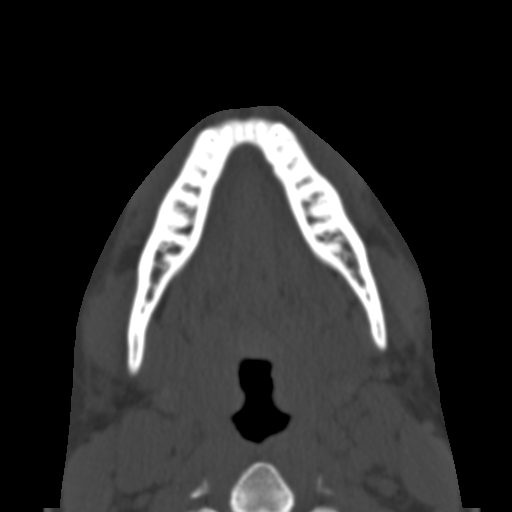
[im 23/81  brain]
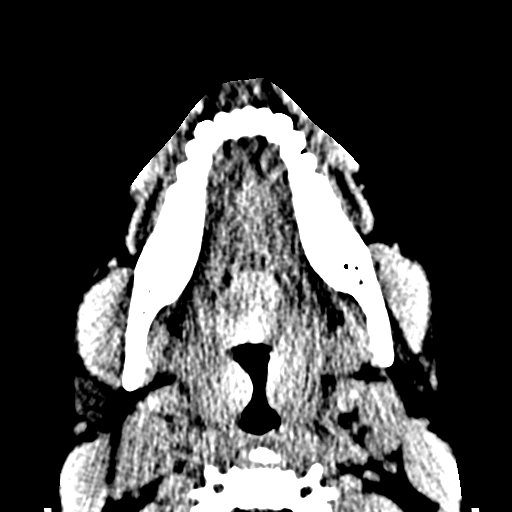
[im 23/81  bone]
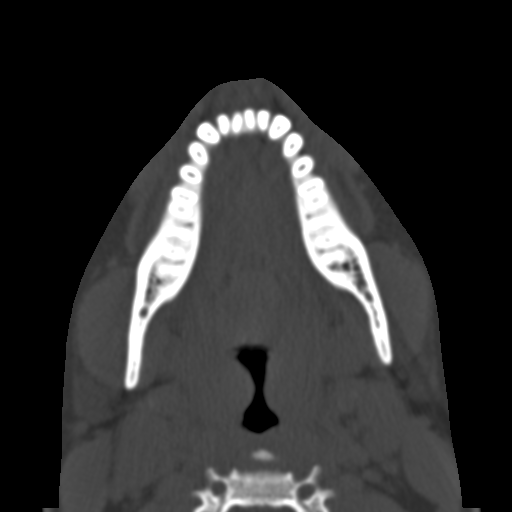
[im 28/81  bone]
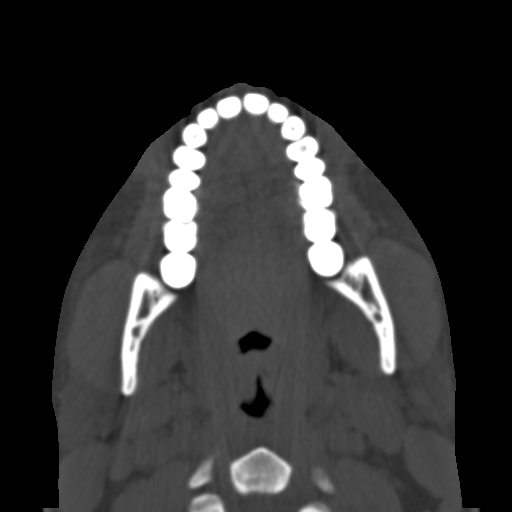
[im 34/81  bone]
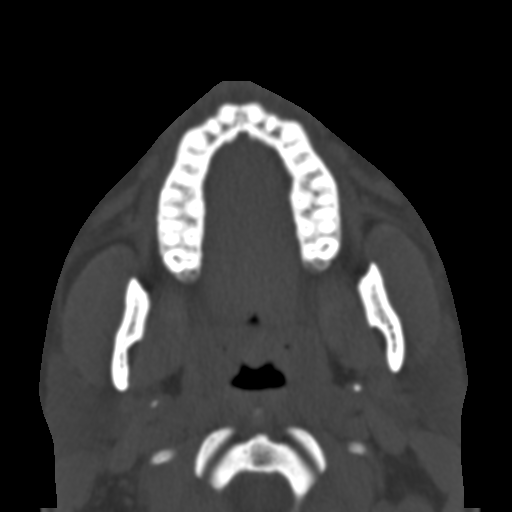
[im 39/81  bone]
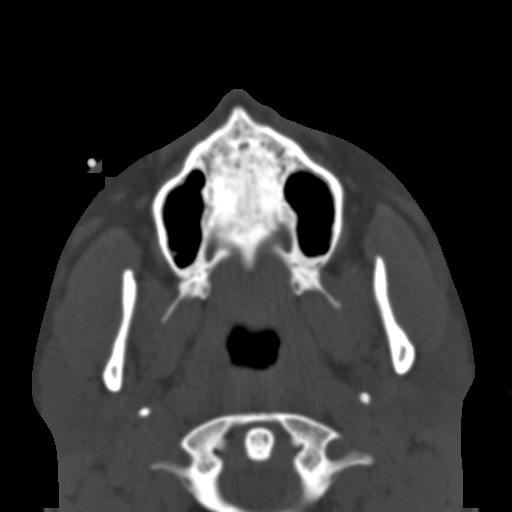
[im 42/81  brain]
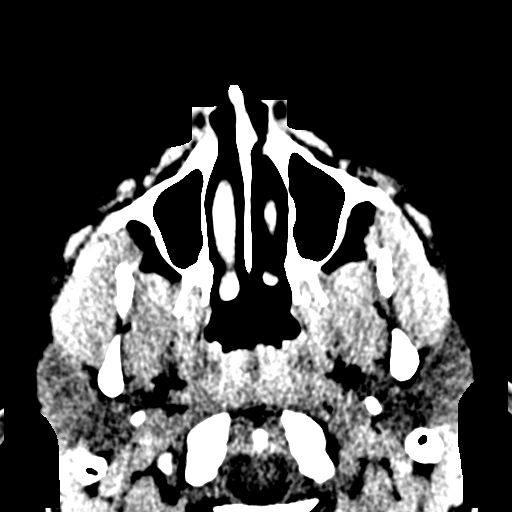
[im 42/81  bone]
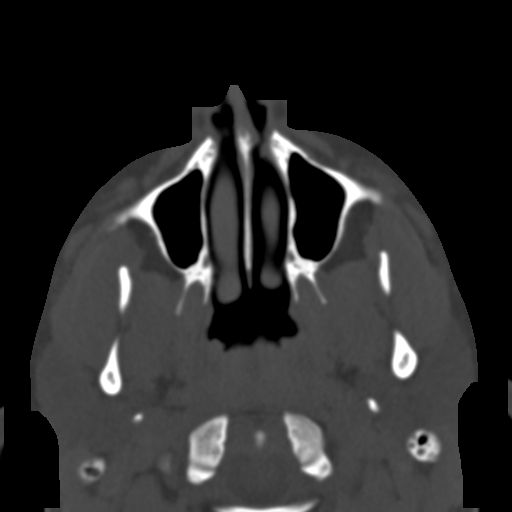
[im 47/81  bone]
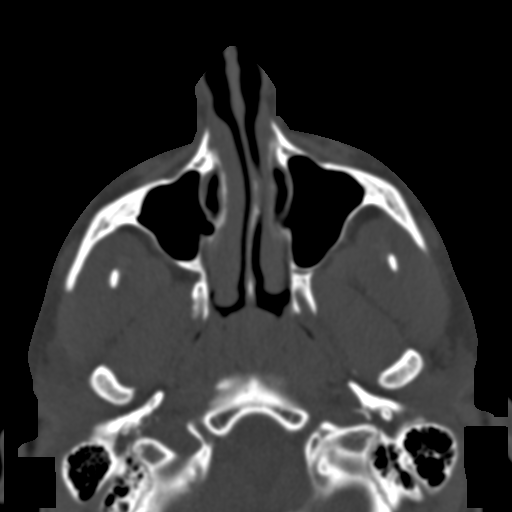
[im 53/81  bone]
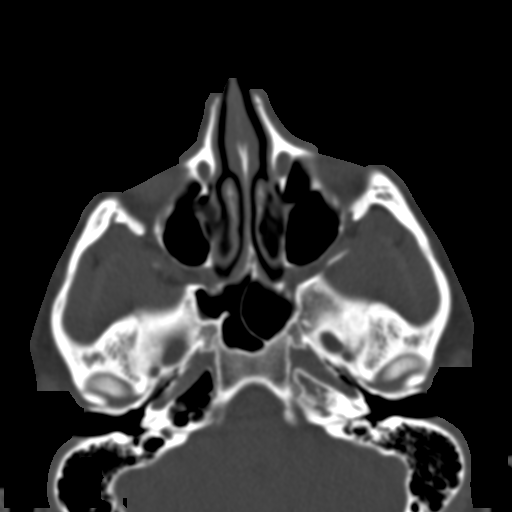
[im 58/81  bone]
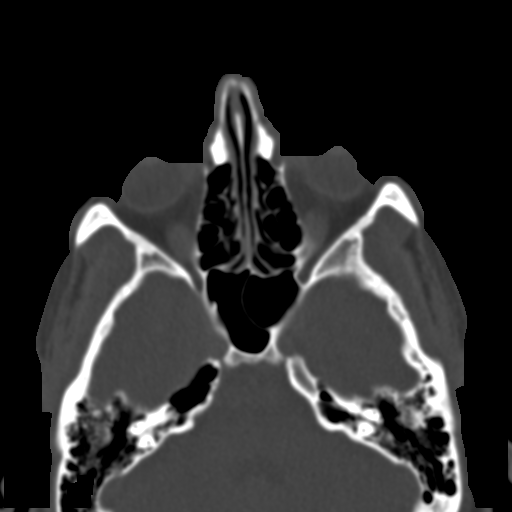
[im 61/81  brain]
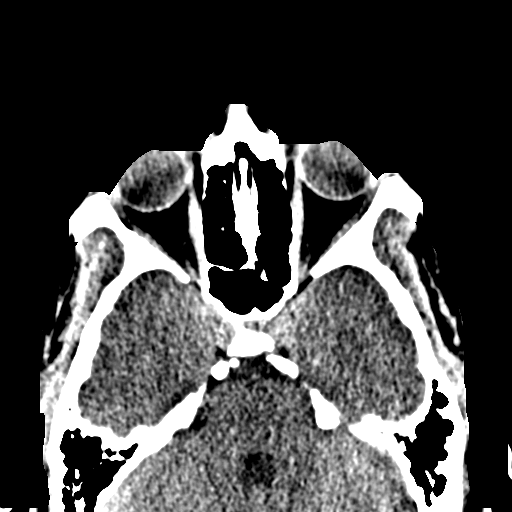
[im 61/81  bone]
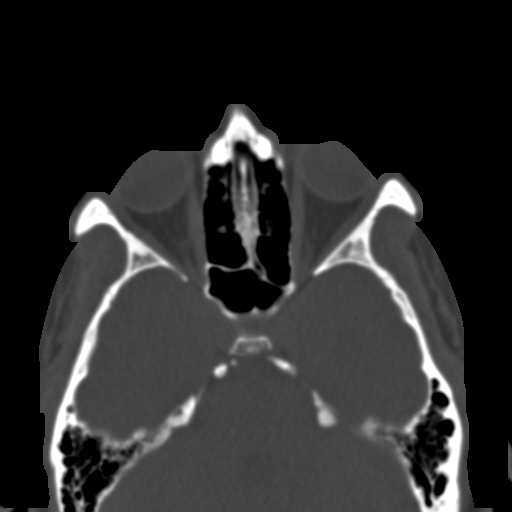
[im 67/81  bone]
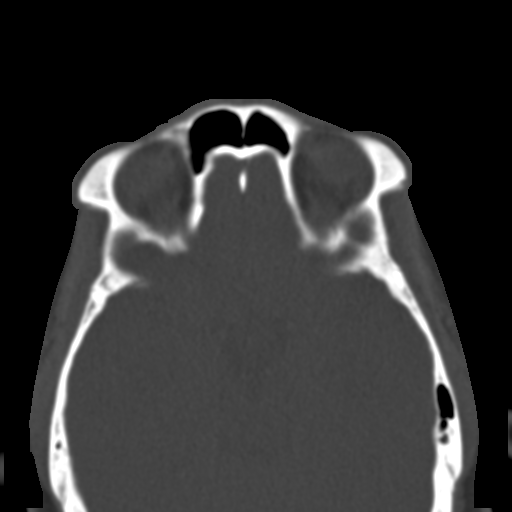
[im 72/81  bone]
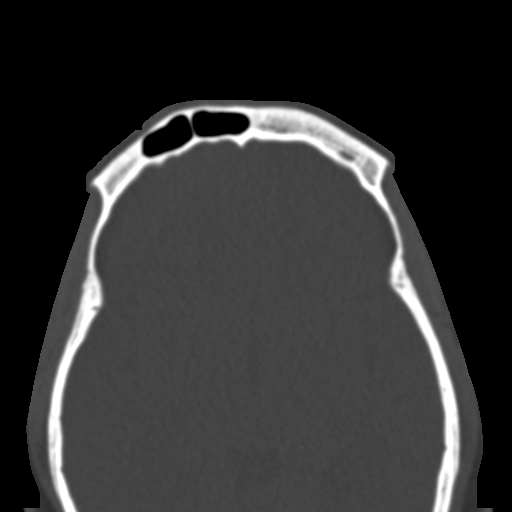
[im 78/81  bone]
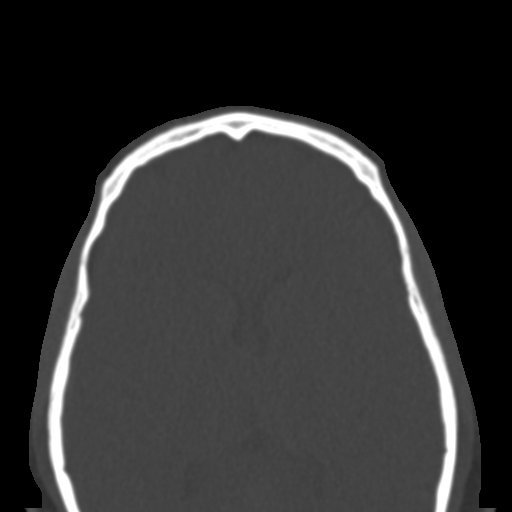

[16 of 30 positions shown; findings below may reference images not displayed]

FINDINGS: The paranasal sinuses, mastoid air cells and middle/inner
ears are clear.
There is no evidence of fracture, subluxation or dislocation.
No focal bony lesions are present.
The orbits and globes are unremarkable.

Tonsillar prominence is noted without other pharyngeal abnormality.
The orbits and globes are unremarkable.
No enlarged lymph nodes or masses are identified.
IMPRESSION: Tonsillar prominence.

No other significant abnormalities identified.

## 2014-04-27 ENCOUNTER — Ambulatory Visit (INDEPENDENT_AMBULATORY_CARE_PROVIDER_SITE_OTHER): Payer: Self-pay | Admitting: Internal Medicine

## 2014-04-27 ENCOUNTER — Encounter: Payer: Self-pay | Admitting: Internal Medicine

## 2014-04-27 VITALS — BP 145/99 | HR 76 | Temp 98.9°F | Wt 130.0 lb

## 2014-04-27 DIAGNOSIS — I10 Essential (primary) hypertension: Secondary | ICD-10-CM

## 2014-04-27 DIAGNOSIS — B20 Human immunodeficiency virus [HIV] disease: Secondary | ICD-10-CM

## 2014-04-27 NOTE — Progress Notes (Signed)
  Subjective:    Patient ID: Patrick Dunlap, male    DOB: Aug 09, 1991, 23 y.o.   MRN: [id number]  HPI  He comes in for follow up of HIV. He was diagnosed last year by the health department where he also had syphilis and chlamydia. He was treated for those although he also did a penicillin injection here. He is ready for treatment and his drug assistance program has been activated. He started his medications over one month ago and feels well.  Is pleased with the regimen.  Some mild nausea with Stribild when he takes it without food but no concerns with it.  No weight loss, no diarrhea.    Review of Systems  Constitutional: Negative for appetite change and fatigue.  HENT: Negative for trouble swallowing.   Gastrointestinal: Negative for nausea and diarrhea.  Skin: Negative for rash.  Neurological: Negative for dizziness, light-headedness and headaches.  Hematological: Negative for adenopathy.       Objective:   Physical Exam  Constitutional: He appears well-developed and well-nourished. No distress.  HENT:  Mouth/Throat: No oropharyngeal exudate.  Eyes: Right eye exhibits no discharge. Left eye exhibits no discharge. No scleral icterus.  Cardiovascular: Normal rate, regular rhythm and normal heart sounds.   No murmur heard. Pulmonary/Chest: Effort normal and breath sounds normal. No respiratory distress. He has no wheezes.  Lymphadenopathy:    He has no cervical adenopathy.  Skin: No rash noted.          Assessment & Plan:

## 2014-04-27 NOTE — Assessment & Plan Note (Signed)
Doing well and no further nausea.  Labs today and rtc 4 months.

## 2014-04-27 NOTE — Assessment & Plan Note (Signed)
Remains elevated here.  Will check at outside pharmacy and follow up next visit.

## 2014-04-28 LAB — HIV-1 RNA QUANT-NO REFLEX-BLD: HIV-1 RNA Quant, Log: <1.3 {Log} (ref <1.30)

## 2014-04-28 LAB — T-HELPER CELL (CD4) - (RCID CLINIC ONLY)
CD4 % Helper T Cell: 17 % — ABNORMAL LOW (ref 33–55)
CD4 T CELL ABS: 360 /uL — AB (ref 400–2700)

## 2014-05-04 ENCOUNTER — Ambulatory Visit: Payer: Self-pay

## 2014-06-05 ENCOUNTER — Other Ambulatory Visit: Payer: Self-pay | Admitting: Internal Medicine

## 2014-06-05 DIAGNOSIS — B20 Human immunodeficiency virus [HIV] disease: Secondary | ICD-10-CM

## 2014-07-07 ENCOUNTER — Other Ambulatory Visit: Payer: Self-pay | Admitting: *Deleted

## 2014-07-07 DIAGNOSIS — B20 Human immunodeficiency virus [HIV] disease: Secondary | ICD-10-CM

## 2014-07-07 MED ORDER — ELVITEG-COBIC-EMTRICIT-TENOFDF 150-150-200-300 MG PO TABS: ORAL_TABLET | ORAL | Status: DC

## 2014-07-26 ENCOUNTER — Encounter (INDEPENDENT_AMBULATORY_CARE_PROVIDER_SITE_OTHER): Payer: Self-pay

## 2014-08-04 ENCOUNTER — Encounter: Payer: Self-pay | Admitting: *Deleted

## 2014-08-04 ENCOUNTER — Other Ambulatory Visit: Payer: Self-pay

## 2014-08-04 DIAGNOSIS — R399 Unspecified symptoms and signs involving the genitourinary system: Secondary | ICD-10-CM

## 2014-08-04 DIAGNOSIS — B20 Human immunodeficiency virus [HIV] disease: Secondary | ICD-10-CM

## 2014-08-04 DIAGNOSIS — Z79899 Other long term (current) drug therapy: Secondary | ICD-10-CM

## 2014-08-04 DIAGNOSIS — Z113 Encounter for screening for infections with a predominantly sexual mode of transmission: Secondary | ICD-10-CM

## 2014-08-04 LAB — COMPLETE METABOLIC PANEL WITH GFR
ALK PHOS: 64 U/L (ref 39–117)
ALT: 16 U/L (ref 0–53)
AST: 26 U/L (ref 0–37)
Albumin: 4.5 g/dL (ref 3.5–5.2)
BILIRUBIN TOTAL: 0.7 mg/dL (ref 0.2–1.2)
BUN: 17 mg/dL (ref 6–23)
CO2: 23 mEq/L (ref 19–32)
Calcium: 9.9 mg/dL (ref 8.4–10.5)
Chloride: 105 mEq/L (ref 96–112)
Creat: 1.17 mg/dL (ref 0.50–1.35)
GFR, EST NON AFRICAN AMERICAN: 87 mL/min
GFR, Est African American: >89 mL/min
GLUCOSE: 83 mg/dL (ref 70–99)
POTASSIUM: 4.3 meq/L (ref 3.5–5.3)
Sodium: 141 mEq/L (ref 135–145)
Total Protein: 7.4 g/dL (ref 6.0–8.3)

## 2014-08-04 LAB — LIPID PANEL
CHOL/HDL RATIO: 3.1 ratio
CHOLESTEROL: 146 mg/dL (ref 0–200)
HDL: 47 mg/dL (ref >39)
LDL Cholesterol: 90 mg/dL (ref 0–99)
TRIGLYCERIDES: 47 mg/dL (ref <150)
VLDL: 9 mg/dL (ref 0–40)

## 2014-08-04 LAB — CBC WITH DIFFERENTIAL/PLATELET
BASOS ABS: 0 10*3/uL (ref 0.0–0.1)
BASOS PCT: 0 % (ref 0–1)
Eosinophils Absolute: 0.1 10*3/uL (ref 0.0–0.7)
Eosinophils Relative: 1 % (ref 0–5)
HEMATOCRIT: 44.1 % (ref 39.0–52.0)
Hemoglobin: 15.2 g/dL (ref 13.0–17.0)
Lymphocytes Relative: 31 % (ref 12–46)
Lymphs Abs: 1.9 10*3/uL (ref 0.7–4.0)
MCH: 30.5 pg (ref 26.0–34.0)
MCHC: 34.5 g/dL (ref 30.0–36.0)
MCV: 88.6 fL (ref 78.0–100.0)
MONO ABS: 0.4 10*3/uL (ref 0.1–1.0)
Monocytes Relative: 7 % (ref 3–12)
NEUTROS ABS: 3.7 10*3/uL (ref 1.7–7.7)
NEUTROS PCT: 61 % (ref 43–77)
Platelets: 273 10*3/uL (ref 150–400)
RBC: 4.98 MIL/uL (ref 4.22–5.81)
RDW: 13 % (ref 11.5–15.5)
WBC: 6 10*3/uL (ref 4.0–10.5)

## 2014-08-04 LAB — T-HELPER CELL (CD4) - (RCID CLINIC ONLY)
CD4 % Helper T Cell: 22 % — ABNORMAL LOW (ref 33–55)
CD4 T CELL ABS: 440 /uL (ref 400–2700)

## 2014-08-04 NOTE — Addendum Note (Signed)
Addended by: Jennet MaduroESTRIDGE, DENISE D on: 08/04/2014 11:03 AM   Modules accepted: Orders

## 2014-08-04 NOTE — Addendum Note (Signed)
Addended bySteva Colder: Rayford Williamsen on: 08/04/2014 11:12 AM   Modules accepted: Orders

## 2014-08-04 NOTE — Progress Notes (Unsigned)
Patient ID: Patrick Dunlap, male   DOB: 02/13/1991, 23 y.o.   MRN: [id number] UTI symptoms, low back pain, whitish discharge.  Pt's last urine sample over a year ago.  Will obtain urinalysis and urine cytology.

## 2014-08-04 NOTE — Addendum Note (Signed)
Addended by: Jennet MaduroESTRIDGE, Krystalle Pilkington D on: 08/04/2014 11:08 AM   Modules accepted: Orders

## 2014-08-05 LAB — URINALYSIS, ROUTINE W REFLEX MICROSCOPIC
BILIRUBIN URINE: NEGATIVE
Glucose, UA: NEGATIVE mg/dL
Hgb urine dipstick: NEGATIVE
Ketones, ur: NEGATIVE mg/dL
LEUKOCYTES UA: NEGATIVE
Nitrite: NEGATIVE
PH: 6 (ref 5.0–8.0)
Protein, ur: 30 mg/dL — AB
SPECIFIC GRAVITY, URINE: 1.022 (ref 1.005–1.030)
Urobilinogen, UA: 1 mg/dL (ref 0.0–1.0)

## 2014-08-05 LAB — URINALYSIS, MICROSCOPIC ONLY
BACTERIA UA: NONE SEEN
CRYSTALS: NONE SEEN
Casts: NONE SEEN
Squamous Epithelial / LPF: NONE SEEN

## 2014-08-05 LAB — RPR: RPR Ser Ql: REACTIVE — AB

## 2014-08-05 LAB — RPR TITER: RPR Titer: 1:4 {titer}

## 2014-08-07 ENCOUNTER — Telehealth: Payer: Self-pay | Admitting: *Deleted

## 2014-08-07 LAB — HIV-1 RNA QUANT-NO REFLEX-BLD

## 2014-08-07 LAB — URINE CYTOLOGY ANCILLARY ONLY
Chlamydia: NEGATIVE
Neisseria Gonorrhea: POSITIVE — AB

## 2014-08-07 LAB — FLUORESCENT TREPONEMAL AB(FTA)-IGG-BLD: FLUORESCENT TREPONEMAL ABS: REACTIVE — AB

## 2014-08-07 NOTE — Telephone Encounter (Signed)
-----   Message from Gardiner Barefootobert W Comer, MD sent at 08/07/2014  3:13 PM EST ----- Gonorrhea positive.  Please give him 250 mg ceftriaxone Im once. thanks

## 2014-08-07 NOTE — Telephone Encounter (Signed)
Pt given appointment tomorrow 3:30 for cefriaxone 250 mg once per Dr. Luciana Axeomer.  Andree CossHowell, Adamarie Izzo M, RN

## 2014-08-08 ENCOUNTER — Ambulatory Visit (INDEPENDENT_AMBULATORY_CARE_PROVIDER_SITE_OTHER): Payer: Self-pay | Admitting: *Deleted

## 2014-08-08 DIAGNOSIS — A549 Gonococcal infection, unspecified: Secondary | ICD-10-CM

## 2014-08-08 MED ORDER — CEFTRIAXONE SODIUM 1 G IJ SOLR
250.0000 mg | Freq: Once | INTRAMUSCULAR | Status: AC
  Administered 2014-08-08: 250 mg via INTRAMUSCULAR

## 2014-08-15 ENCOUNTER — Ambulatory Visit: Payer: Self-pay | Admitting: Internal Medicine

## 2014-08-23 ENCOUNTER — Other Ambulatory Visit: Payer: Self-pay | Admitting: *Deleted

## 2014-08-23 DIAGNOSIS — B20 Human immunodeficiency virus [HIV] disease: Secondary | ICD-10-CM

## 2014-08-23 MED ORDER — ELVITEG-COBIC-EMTRICIT-TENOFDF 150-150-200-300 MG PO TABS
ORAL_TABLET | ORAL | Status: DC
Start: 2014-08-23 — End: 2014-12-01

## 2014-08-28 ENCOUNTER — Ambulatory Visit: Payer: Self-pay | Admitting: Internal Medicine

## 2014-10-12 ENCOUNTER — Encounter (HOSPITAL_COMMUNITY): Payer: Self-pay | Admitting: *Deleted

## 2014-10-12 ENCOUNTER — Emergency Department (HOSPITAL_COMMUNITY)
Admission: EM | Admit: 2014-10-12 | Discharge: 2014-10-12 | Disposition: A | Discharge status: Home / Self Care | Payer: Self-pay | Attending: Emergency Medicine | Admitting: Emergency Medicine

## 2014-10-12 DIAGNOSIS — Z880 Allergy status to penicillin: Secondary | ICD-10-CM | POA: Insufficient documentation

## 2014-10-12 DIAGNOSIS — S30860A Insect bite (nonvenomous) of lower back and pelvis, initial encounter: Secondary | ICD-10-CM | POA: Insufficient documentation

## 2014-10-12 DIAGNOSIS — W57XXXA Bitten or stung by nonvenomous insect and other nonvenomous arthropods, initial encounter: Secondary | ICD-10-CM | POA: Insufficient documentation

## 2014-10-12 DIAGNOSIS — Y9289 Other specified places as the place of occurrence of the external cause: Secondary | ICD-10-CM | POA: Insufficient documentation

## 2014-10-12 DIAGNOSIS — Z79899 Other long term (current) drug therapy: Secondary | ICD-10-CM | POA: Insufficient documentation

## 2014-10-12 DIAGNOSIS — Y9389 Activity, other specified: Secondary | ICD-10-CM | POA: Insufficient documentation

## 2014-10-12 DIAGNOSIS — Z21 Asymptomatic human immunodeficiency virus [HIV] infection status: Secondary | ICD-10-CM | POA: Insufficient documentation

## 2014-10-12 DIAGNOSIS — Y998 Other external cause status: Secondary | ICD-10-CM | POA: Insufficient documentation

## 2014-10-12 NOTE — ED Notes (Signed)
Pt reports possible black widow bite to L lower back x 1 hour ago.  Pt reports at the time of the bite, area was very painful.  Pt denies any pain at this time.  Area appears red, no swelling noted.

## 2014-10-12 NOTE — Discharge Instructions (Signed)
You may use cool or warm compresses to help with discomfort. You may also use over the counter benadryl every 6 to 8 hours as needed for itching. Tylenol and ibuprofen may be taken as needed for pain.

## 2014-10-12 NOTE — ED Provider Notes (Signed)
CSN: [id number]     Arrival date & time 10/12/14  1625 History  This chart was scribed for non-physician practitioner, Junius FinnerErin O'Malley, PA-C working with Gwyneth SproutWhitney Plunkett, MD by Luisa DagoPriscilla Tutu, ED scribe. This patient was seen in room WTR5/WTR5 and the patient's care was started at 5:09 PM.  Chief Complaint  Patient presents with  . Insect Bite   The history is provided by the patient and medical records. No language interpreter was used.   HPI Comments: Patrick Dunlap is a 24 y.o. male with PMhx of HIV presents to the Emergency Department complaining of possible insect bite to his left lower back that he first noted 1 hour ago. He thinks that it was a black widow, which he states that he saw. Pt states that the area was hurting after the bite, but the pain has resolved. No pain medication taken PTA.  States he looked up the spider on the Internet which told pt to go to the ED.  Pt denies fever, neck pain, sore throat, visual disturbance, CP, cough, SOB, abdominal pain, nausea, emesis, diarrhea, urinary symptoms, back pain, HA, weakness, numbness and rash as associated symptoms.     Past Medical History  Diagnosis Date  . HIV infection    History reviewed. No pertinent past surgical history. Family History  Problem Relation Age of Onset  . Healthy Mother   . Healthy Father   . Healthy Brother    History  Substance Use Topics  . Smoking status: Never Smoker   . Smokeless tobacco: Never Used  . Alcohol Use: 0.0 oz/week     Comment: occasional    Review of Systems  Constitutional: Negative for fever.  Musculoskeletal: Negative for myalgias, joint swelling and arthralgias.  Neurological: Negative for weakness and numbness.      Allergies  Penicillins  Home Medications   Prior to Admission medications   Medication Sig Start Date End Date Taking? Authorizing Provider  acetaminophen (TYLENOL) 500 MG tablet Take 500 mg by mouth every 6 (six) hours as needed for mild pain or  headache.    Yes Historical Provider, MD  elvitegravir-cobicistat-emtricitabine-tenofovir (STRIBILD) 150-150-200-300 MG TABS tablet TAKE 1 TABLET BY MOUTH DAILY WITH BREAKFAST Patient taking differently: Take 1 tablet by mouth daily with breakfast.  08/23/14  Yes Gardiner Barefootobert W Comer, MD   BP 157/100 mmHg  Pulse 96  Temp(Src) 98.2 F (36.8 C) (Oral)  Resp 18  SpO2 100%  Physical Exam  Constitutional: He is oriented to person, place, and time. He appears well-developed and well-nourished.  HENT:  Head: Normocephalic and atraumatic.  Eyes: EOM are normal.  Neck: Normal range of motion.  Cardiovascular: Normal rate.   Pulmonary/Chest: Effort normal.  Musculoskeletal: Normal range of motion.  Neurological: He is alert and oriented to person, place, and time.  Skin: Skin is warm and dry.  1 cm circular area of eryhema with mild tenderness to left lower back. No induration or fluctuants. No bleeding or discharge. No ecchymosis.   Psychiatric: He has a normal mood and affect. His behavior is normal.  Nursing note and vitals reviewed.   ED Course  Procedures (including critical care time)  DIAGNOSTIC STUDIES: Oxygen Saturation is 100% on RA, normal by my interpretation.    COORDINATION OF CARE: 5:13 PM- Advised pt to apply warm compressions to the effected area. Advised to watch for signs of infections .Pt advised of plan for treatment and pt agrees.  Labs Review Labs Reviewed - No data to display  Imaging  Review No results found.   EKG Interpretation None      MDM   Final diagnoses:  Insect bite    Pt presenting to ED with concern for possible black widow insect bite to left lower back about 1-2 hours PTA. Pt denies allergies to bees or other insects.  Pain was severe at the time but has since resolved. Denies difficulty breathing, swallowing, nausea, vomiting, or rash. Mild erythema to left lower back. No evidence of ulceration. No evidence of anaphylaxis.  Home care  instructions provided. Return precautions provided. Pt verbalized understanding and agreement with tx plan.   I personally performed the services described in this documentation, which was scribed in my presence. The recorded information has been reviewed and is accurate.    Junius Finner, PA-C 10/12/14 1728  Gwyneth Sprout, MD 10/12/14 [id number]

## 2014-11-01 ENCOUNTER — Ambulatory Visit: Payer: Self-pay

## 2014-11-01 ENCOUNTER — Other Ambulatory Visit: Payer: Self-pay

## 2014-11-16 ENCOUNTER — Ambulatory Visit: Payer: Self-pay | Admitting: Internal Medicine

## 2014-11-16 ENCOUNTER — Telehealth: Payer: Self-pay | Admitting: *Deleted

## 2014-11-16 NOTE — Telephone Encounter (Signed)
No show

## 2014-11-21 ENCOUNTER — Encounter (HOSPITAL_COMMUNITY): Payer: Self-pay | Admitting: Family Medicine

## 2014-11-21 ENCOUNTER — Emergency Department (HOSPITAL_COMMUNITY)
Admission: EM | Admit: 2014-11-21 | Discharge: 2014-11-21 | Disposition: A | Discharge status: Home / Self Care | Payer: Self-pay | Attending: Emergency Medicine | Admitting: Emergency Medicine

## 2014-11-21 DIAGNOSIS — R059 Cough, unspecified: Secondary | ICD-10-CM

## 2014-11-21 DIAGNOSIS — J029 Acute pharyngitis, unspecified: Secondary | ICD-10-CM

## 2014-11-21 DIAGNOSIS — Z792 Long term (current) use of antibiotics: Secondary | ICD-10-CM | POA: Insufficient documentation

## 2014-11-21 DIAGNOSIS — R05 Cough: Secondary | ICD-10-CM

## 2014-11-21 LAB — RAPID STREP SCREEN (MED CTR MEBANE ONLY): STREPTOCOCCUS, GROUP A SCREEN (DIRECT): NEGATIVE

## 2014-11-21 NOTE — ED Provider Notes (Signed)
CSN: [id number]     Arrival date & time 11/21/14  1858 History  This chart was scribed for non-physician practitioner, Teressa LowerVrinda Jennefer Kopp, NP working with Tilden FossaElizabeth Rees, MD by Gwenyth Oberatherine Macek, ED scribe. This patient was seen in room WTR7/WTR7 and the patient's care was started at 7:31 PM   Chief Complaint  Patient presents with  . Sore Throat  . Cough   The history is provided by the patient. No language interpreter was used.    HPI Comments: Sergio Hatfield is a 24 y.o. male who presents to the Emergency Department complaining of constant, moderate sore throat that started yesterday. Pt states chills, dry cough and HA as associated symptoms. He tried unspecified OTC treatment with no relief. 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 He denies fever as an associated symptom.  History reviewed. No pertinent past medical history. History reviewed. No pertinent past surgical history. Family History  Problem Relation Age of Onset  . Hypertension Other   . Diabetes Other   . Heart failure Other    History  Substance Use Topics  . Smoking status: Never Smoker   . Smokeless tobacco: Not on file  . Alcohol Use: Yes     Comment: Usually drinks 1 once a month    Review of Systems  Constitutional: Positive for chills. Negative for fever.  HENT: Positive for sore throat.   Respiratory: Positive for cough.   All other systems reviewed and are negative.  Allergies  Review of patient's allergies indicates no known allergies.  Home Medications   Prior to Admission medications   Medication Sig Start Date End Date Taking? Authorizing Provider  benzocaine (ORAJEL) 10 % mucosal gel Use as directed 1 application in the mouth or throat as needed for pain.    Historical Provider, MD  clindamycin (CLEOCIN) 150 MG capsule Take 2 capsules (300 mg total) by mouth 3 (three) times daily. 11/17/12   Nelia Shiobert L Beaton, MD  DM-Phenylephrine-Acetaminophen 10-5-325 MG TABS Take 15 mLs by mouth 2  (two) times daily as needed (for cold symptoms and pain).    Historical Provider, MD  HYDROcodone-acetaminophen (NORCO/VICODIN) 5-325 MG per tablet Take 1 tablet by mouth every 6 (six) hours as needed for pain. 11/01/12   Derwood KaplanAnkit Nanavati, MD  ibuprofen (ADVIL,MOTRIN) 600 MG tablet Take 1 tablet (600 mg total) by mouth every 6 (six) hours as needed for pain. 11/01/12   Derwood KaplanAnkit Nanavati, MD  lip balm (BLISTEX) OINT Apply 1 application topically as needed (for dry lips).    Historical Provider, MD  oxyCODONE-acetaminophen (PERCOCET) 7.5-325 MG per tablet Take 1 tablet by mouth every 4 (four) hours as needed for pain. 11/17/12   Nelia Shiobert L Beaton, MD  phenol (CHLORASEPTIC) 1.4 % LIQD Use as directed 1 spray in the mouth or throat as needed (for pain).    Historical Provider, MD  Phenyleph-Doxyl-DM-Aspirin (ALKA-SELTZER PLUS DAY/NIGHT PO) Take 1 tablet by mouth 2 (two) times daily as needed (for cold symptoms or pain).    Historical Provider, MD   BP 167/81 mmHg  Pulse 94  Temp(Src) 99.3 F (37.4 C) (Oral)  Resp 18  Ht 6' (1.829 m)  Wt 280 lb (127.007 kg)  BMI 37.97 kg/m2  SpO2 98% Physical Exam  ED Course  Procedures  DIAGNOSTIC STUDIES: Oxygen Saturation is 98% on RA, normal by my interpretation.    COORDINATION OF CARE: 7:33 PM Discussed treatment plan with pt which includes Strep screen. He agreed to plan.  Labs Review Labs  Reviewed  RAPID STREP SCREEN  CULTURE, GROUP A STREP    Imaging Review No results found.   EKG Interpretation None      MDM   Final diagnoses:  Sore throat  Cough   Likely viral in nature. Don't thin any antibiotics are needed at this time  I personally performed the services described in this documentation, which was scribed in my presence. The recorded information has been reviewed and is accurate.   Teressa Lower, NP 11/21/14 2012  Tilden Fossa, MD 11/21/14 2024

## 2014-11-21 NOTE — ED Notes (Signed)
Pt states he has only coughed 2-3 times but it has mostly been a dry cough.

## 2014-11-21 NOTE — Discharge Instructions (Signed)
Pharyngitis Pharyngitis is redness, pain, and swelling (inflammation) of your pharynx.  CAUSES  Pharyngitis is usually caused by infection. Most of the time, these infections are from viruses (viral) and are part of a cold. However, sometimes pharyngitis is caused by bacteria (bacterial). Pharyngitis can also be caused by allergies. Viral pharyngitis may be spread from person to person by coughing, sneezing, and personal items or utensils (cups, forks, spoons, toothbrushes). Bacterial pharyngitis may be spread from person to person by more intimate contact, such as kissing.  SIGNS AND SYMPTOMS  Symptoms of pharyngitis include:   Sore throat.   Tiredness (fatigue).   Low-grade fever.   Headache.  Joint pain and muscle aches.  Skin rashes.  Swollen lymph nodes.  Plaque-like film on throat or tonsils (often seen with bacterial pharyngitis). DIAGNOSIS  Your health care provider will ask you questions about your illness and your symptoms. Your medical history, along with a physical exam, is often all that is needed to diagnose pharyngitis. Sometimes, a rapid strep test is done. Other lab tests may also be done, depending on the suspected cause.  TREATMENT  Viral pharyngitis will usually get better in 3-4 days without the use of medicine. Bacterial pharyngitis is treated with medicines that kill germs (antibiotics).  HOME CARE INSTRUCTIONS   Drink enough water and fluids to keep your urine clear or pale yellow.   Only take over-the-counter or prescription medicines as directed by your health care provider:   If you are prescribed antibiotics, make sure you finish them even if you start to feel better.   Do not take aspirin.   Get lots of rest.   Gargle with 8 oz of salt water ( tsp of salt per 1 qt of water) as often as every 1-2 hours to soothe your throat.   Throat lozenges (if you are not at risk for choking) or sprays may be used to soothe your throat. SEEK MEDICAL  CARE IF:   You have large, tender lumps in your neck.  You have a rash.  You cough up green, yellow-brown, or bloody spit. SEEK IMMEDIATE MEDICAL CARE IF:   Your neck becomes stiff.  You drool or are unable to swallow liquids.  You vomit or are unable to keep medicines or liquids down.  You have severe pain that does not go away with the use of recommended medicines.  You have trouble breathing (not caused by a stuffy nose). MAKE SURE YOU:   Understand these instructions.  Will watch your condition.  Will get help right away if you are not doing well or get worse. Document Released: 09/08/2005 Document Revised: 06/29/2013 Document Reviewed: 05/16/2013 [facility] Patient Information 2015 [employer] [place]. This information is not intended to replace advice given to you by your health care provider. Make sure you discuss any questions you have with your health care provider.  Viral Infections A virus is a type of germ. Viruses can cause:  Minor sore throats.  Aches and pains.  Headaches.  Runny nose.  Rashes.  Watery eyes.  Tiredness.  Coughs.  Loss of appetite.  Feeling sick to your stomach (nausea).  Throwing up (vomiting).  Watery poop (diarrhea). HOME CARE   Only take medicines as told by your doctor.  Drink enough water and fluids to keep your pee (urine) clear or pale yellow. Sports drinks are a good choice.  Get plenty of rest and eat healthy. Soups and broths with crackers or rice are fine. GET HELP RIGHT AWAY IF:  You have a very bad headache.  You have shortness of breath.  You have chest pain or neck pain.  You have an unusual rash.  You cannot stop throwing up.  You have watery poop that does not stop.  You cannot keep fluids down.  You or your child has a temperature by mouth above 102 F (38.9 C), not controlled by medicine.  Your baby is older than 3 months with a rectal temperature of 102 F (38.9 C) or higher.  Your baby  is 4 months old or younger with a rectal temperature of 100.4 F (38 C) or higher. MAKE SURE YOU:   Understand these instructions.  Will watch this condition.  Will get help right away if you are not doing well or get worse. Document Released: 08/21/2008 Document Revised: 12/01/2011 Document Reviewed: 01/14/2011 [facility] Patient Information 2015 [place], [place]. This information is not intended to replace advice given to you by your health care provider. Make sure you discuss any questions you have with your health care provider.

## 2014-11-23 ENCOUNTER — Encounter (HOSPITAL_COMMUNITY): Payer: Self-pay | Admitting: Emergency Medicine

## 2014-11-23 ENCOUNTER — Emergency Department (HOSPITAL_COMMUNITY)
Admission: EM | Admit: 2014-11-23 | Discharge: 2014-11-23 | Disposition: A | Discharge status: Home / Self Care | Payer: Self-pay | Attending: Emergency Medicine | Admitting: Emergency Medicine

## 2014-11-23 DIAGNOSIS — R042 Hemoptysis: Secondary | ICD-10-CM | POA: Insufficient documentation

## 2014-11-23 DIAGNOSIS — Z792 Long term (current) use of antibiotics: Secondary | ICD-10-CM | POA: Insufficient documentation

## 2014-11-23 DIAGNOSIS — R5383 Other fatigue: Secondary | ICD-10-CM | POA: Insufficient documentation

## 2014-11-23 DIAGNOSIS — R110 Nausea: Secondary | ICD-10-CM | POA: Insufficient documentation

## 2014-11-23 DIAGNOSIS — R197 Diarrhea, unspecified: Secondary | ICD-10-CM | POA: Insufficient documentation

## 2014-11-23 LAB — COMPREHENSIVE METABOLIC PANEL
ALK PHOS: 57 U/L (ref 39–117)
ALT: 39 U/L (ref 0–53)
AST: 43 U/L — AB (ref 0–37)
Albumin: 3.8 g/dL (ref 3.5–5.2)
Anion gap: 8 (ref 5–15)
BUN: 7 mg/dL (ref 6–23)
CALCIUM: 8.8 mg/dL (ref 8.4–10.5)
CHLORIDE: 106 mmol/L (ref 96–112)
CO2: 25 mmol/L (ref 19–32)
Creatinine, Ser: 0.95 mg/dL (ref 0.50–1.35)
GFR calc Af Amer: >90 mL/min (ref >90)
GLUCOSE: 108 mg/dL — AB (ref 70–99)
POTASSIUM: 3.3 mmol/L — AB (ref 3.5–5.1)
SODIUM: 139 mmol/L (ref 135–145)
Total Bilirubin: 0.6 mg/dL (ref 0.3–1.2)
Total Protein: 7.5 g/dL (ref 6.0–8.3)

## 2014-11-23 LAB — CBC WITH DIFFERENTIAL/PLATELET
BASOS ABS: 0 10*3/uL (ref 0.0–0.1)
BASOS PCT: 0 % (ref 0–1)
Eosinophils Absolute: 0 10*3/uL (ref 0.0–0.7)
Eosinophils Relative: 0 % (ref 0–5)
HEMATOCRIT: 46.6 % (ref 39.0–52.0)
HEMOGLOBIN: 15.8 g/dL (ref 13.0–17.0)
Lymphocytes Relative: 27 % (ref 12–46)
Lymphs Abs: 1.5 10*3/uL (ref 0.7–4.0)
MCH: 30.4 pg (ref 26.0–34.0)
MCHC: 33.9 g/dL (ref 30.0–36.0)
MCV: 89.6 fL (ref 78.0–100.0)
MONO ABS: 1 10*3/uL (ref 0.1–1.0)
Monocytes Relative: 18 % — ABNORMAL HIGH (ref 3–12)
NEUTROS ABS: 3 10*3/uL (ref 1.7–7.7)
Neutrophils Relative %: 55 % (ref 43–77)
Platelets: 206 10*3/uL (ref 150–400)
RBC: 5.2 MIL/uL (ref 4.22–5.81)
RDW: 13.2 % (ref 11.5–15.5)
WBC: 5.5 10*3/uL (ref 4.0–10.5)

## 2014-11-23 LAB — URINALYSIS, ROUTINE W REFLEX MICROSCOPIC
BILIRUBIN URINE: NEGATIVE
GLUCOSE, UA: NEGATIVE mg/dL
Hgb urine dipstick: NEGATIVE
KETONES UR: NEGATIVE mg/dL
Leukocytes, UA: NEGATIVE
NITRITE: NEGATIVE
PH: 6.5 (ref 5.0–8.0)
PROTEIN: NEGATIVE mg/dL
Specific Gravity, Urine: 1.006 (ref 1.005–1.030)
Urobilinogen, UA: 1 mg/dL (ref 0.0–1.0)

## 2014-11-23 LAB — LIPASE, BLOOD: Lipase: 26 U/L (ref 11–59)

## 2014-11-23 MED ORDER — HYDROCODONE-HOMATROPINE 5-1.5 MG/5ML PO SYRP: 5.0000 mL | ORAL_SOLUTION | Freq: Four times a day (QID) | ORAL | Status: DC | PRN

## 2014-11-23 MED ORDER — SODIUM CHLORIDE 0.9 % IV BOLUS (SEPSIS)
1000.0000 mL | Freq: Once | INTRAVENOUS | Status: AC
  Administered 2014-11-23: 1000 mL via INTRAVENOUS

## 2014-11-23 NOTE — ED Notes (Signed)
Pt states has been coughing since Monday. States came here Tuesday and was told a viral infection. Pt states now worse, coughing up mucous with red tinge, has diarrhea and sharp pains in chest.

## 2014-11-23 NOTE — Discharge Instructions (Signed)
As discussed, your evaluation today has been largely reassuring.  But, it is important that you monitor your condition carefully, and do not hesitate to return to the ED if you develop new, or concerning changes in your condition.  Otherwise, please follow-up in 4 days at our affiliated outpatient clinic.

## 2014-11-23 NOTE — ED Provider Notes (Signed)
CSN: [id number]     Arrival date & time 11/23/14  1744 History   First MD Initiated Contact with Patient 11/23/14 1745     Chief Complaint  Patient presents with  . Cough  . Diarrhea   HPI   Patient presents with concern of ongoing generalized abdominal discomfort, cough, diarrhea. Illness began 4 days ago.  Since onset symptoms have progressed in spite of using OTC medication. Concerning to the patient is the development of new hemarthrosis. No new near-syncope syncope, chest pain, fever or Patient does describe chills, shaking sensation. He denies urinary complaints. 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 Since eval two days ago (here) the hemoptysis is new.   History reviewed. No pertinent past medical history. History reviewed. No pertinent past surgical history. Family History  Problem Relation Age of Onset  . Hypertension Other   . Diabetes Other   . Heart failure Other    History  Substance Use Topics  . Smoking status: Never Smoker   . Smokeless tobacco: Not on file  . Alcohol Use: Yes     Comment: Usually drinks 1 once a month    Review of Systems  Constitutional: Positive for fatigue.  HENT:       Per HPI, otherwise negative  Respiratory:       Per HPI, otherwise negative  Cardiovascular:       Per HPI, otherwise negative  Gastrointestinal: Positive for nausea and diarrhea. Negative for vomiting.  Endocrine:       Negative aside from HPI  Genitourinary:       Neg aside from HPI   Musculoskeletal:       Per HPI, otherwise negative  Skin: Negative for rash.  Neurological: Negative for syncope.      Allergies  Review of patient's allergies indicates no known allergies.  Home Medications   Prior to Admission medications   Medication Sig Start Date End Date Taking? Authorizing Provider  clindamycin (CLEOCIN) 150 MG capsule Take 2 capsules (300 mg total) by mouth 3 (three) times daily. Patient not taking:  Reported on 11/21/2014 11/17/12   Nelia Shi, MD  HYDROcodone-acetaminophen (NORCO/VICODIN) 5-325 MG per tablet Take 1 tablet by mouth every 6 (six) hours as needed for pain. Patient not taking: Reported on 11/21/2014 11/01/12   Derwood Kaplan, MD  ibuprofen (ADVIL,MOTRIN) 600 MG tablet Take 1 tablet (600 mg total) by mouth every 6 (six) hours as needed for pain. Patient not taking: Reported on 11/21/2014 11/01/12   Derwood Kaplan, MD  oxyCODONE-acetaminophen (PERCOCET) 7.5-325 MG per tablet Take 1 tablet by mouth every 4 (four) hours as needed for pain. Patient not taking: Reported on 11/21/2014 11/17/12   Nelia Shi, MD  Phenyleph-CPM-DM-APAP (TYLENOL COLD MULTI-SYMPTOM PO) Take 2 tablets by mouth every 4 (four) hours as needed (cold symptoms).    Historical Provider, MD   BP 164/94 mmHg  Pulse 99  Temp(Src) 98.1 F (36.7 C) (Oral)  Resp 20  SpO2 97% Physical Exam  Constitutional: He is oriented to person, place, and time. He appears well-developed. No distress.  HENT:  Head: Normocephalic and atraumatic.  Mouth/Throat: No oropharyngeal exudate.  Mild symmetric posterior oropharyngeal erythema w/o abscess or exudate. Uvula midline non-edematous.  Eyes: Conjunctivae and EOM are normal.  Cardiovascular: Normal rate and regular rhythm.   Pulmonary/Chest: Effort normal. No stridor. No respiratory distress.  Abdominal: He exhibits no distension.  Musculoskeletal: He exhibits no edema.  Neurological: He is  alert and oriented to person, place, and time.  Skin: Skin is warm and dry.  Psychiatric: He has a normal mood and affect.  Nursing note and vitals reviewed.   ED Course  Procedures (including critical care time) Labs Review Labs Reviewed  CBC WITH DIFFERENTIAL/PLATELET - Abnormal; Notable for the following:    Monocytes Relative 18 (*)    All other components within normal limits  COMPREHENSIVE METABOLIC PANEL - Abnormal; Notable for the following:    Potassium 3.3 (*)     Glucose, Bld 108 (*)    AST 43 (*)    All other components within normal limits  LIPASE, BLOOD  URINALYSIS, ROUTINE W REFLEX MICROSCOPIC    I reviewed the EMR, including recent visit w diagnosis of likely viral infection.  8:15 PM Patient in no distress, using his file telephone, appears comfortable. Vital signs are unremarkable. We discussed all findings from today, and from 2 days ago. Patient was started on antitussive medication. Patient was provided outpatient follow-up instructions on Monday, 4 days from now at our affiliated outpatient center.   MDM  Patient p/w ongoing generalized illness as well as new hemoptysis.  No evidence for substantial blood loss, as the patient's vital signs are reassuring, hemoglobin stable. No evidence for bacteremia or sepsis. No evidence for pneumonia. Patient awake, alert, appropriately interactive throughout his emergency department evaluation. Patient will be started on new antitussive medication, avoid work, follow-up at our affiliated outpatient center in 4 days.    Gerhard Munchobert Inella Kuwahara, MD 11/23/14 2019

## 2014-11-24 LAB — CULTURE, GROUP A STREP: STREP A CULTURE: NEGATIVE

## 2014-11-30 ENCOUNTER — Ambulatory Visit: Payer: Self-pay

## 2014-11-30 ENCOUNTER — Other Ambulatory Visit (INDEPENDENT_AMBULATORY_CARE_PROVIDER_SITE_OTHER): Payer: Self-pay

## 2014-11-30 DIAGNOSIS — Z113 Encounter for screening for infections with a predominantly sexual mode of transmission: Secondary | ICD-10-CM

## 2014-11-30 DIAGNOSIS — B20 Human immunodeficiency virus [HIV] disease: Secondary | ICD-10-CM

## 2014-11-30 LAB — CBC WITH DIFFERENTIAL/PLATELET
BASOS ABS: 0 10*3/uL (ref 0.0–0.1)
BASOS PCT: 0 % (ref 0–1)
EOS PCT: 1 % (ref 0–5)
Eosinophils Absolute: 0.1 10*3/uL (ref 0.0–0.7)
HCT: 43.9 % (ref 39.0–52.0)
Hemoglobin: 14.9 g/dL (ref 13.0–17.0)
Lymphocytes Relative: 34 % (ref 12–46)
Lymphs Abs: 2.2 10*3/uL (ref 0.7–4.0)
MCH: 30 pg (ref 26.0–34.0)
MCHC: 33.9 g/dL (ref 30.0–36.0)
MCV: 88.5 fL (ref 78.0–100.0)
MONO ABS: 0.4 10*3/uL (ref 0.1–1.0)
MPV: 10.4 fL (ref 8.6–12.4)
Monocytes Relative: 6 % (ref 3–12)
NEUTROS ABS: 3.9 10*3/uL (ref 1.7–7.7)
Neutrophils Relative %: 59 % (ref 43–77)
PLATELETS: 292 10*3/uL (ref 150–400)
RBC: 4.96 MIL/uL (ref 4.22–5.81)
RDW: 13.4 % (ref 11.5–15.5)
WBC: 6.6 10*3/uL (ref 4.0–10.5)

## 2014-11-30 LAB — COMPLETE METABOLIC PANEL WITH GFR
ALK PHOS: 62 U/L (ref 39–117)
ALT: 15 U/L (ref 0–53)
AST: 25 U/L (ref 0–37)
Albumin: 4.6 g/dL (ref 3.5–5.2)
BUN: 15 mg/dL (ref 6–23)
CHLORIDE: 103 meq/L (ref 96–112)
CO2: 28 meq/L (ref 19–32)
Calcium: 9.2 mg/dL (ref 8.4–10.5)
Creat: 1.1 mg/dL (ref 0.50–1.35)
GFR, Est Non African American: >89 mL/min
Glucose, Bld: 91 mg/dL (ref 70–99)
POTASSIUM: 4.2 meq/L (ref 3.5–5.3)
Sodium: 140 mEq/L (ref 135–145)
Total Bilirubin: 0.4 mg/dL (ref 0.2–1.2)
Total Protein: 7.3 g/dL (ref 6.0–8.3)

## 2014-12-01 ENCOUNTER — Other Ambulatory Visit: Payer: Self-pay | Admitting: *Deleted

## 2014-12-01 DIAGNOSIS — B20 Human immunodeficiency virus [HIV] disease: Secondary | ICD-10-CM

## 2014-12-01 LAB — RPR TITER: RPR Titer: 1:2 {titer}

## 2014-12-01 LAB — T-HELPER CELL (CD4) - (RCID CLINIC ONLY)
CD4 T CELL HELPER: 27 % — AB (ref 33–55)
CD4 T Cell Abs: 600 /uL (ref 400–2700)

## 2014-12-01 LAB — RPR: RPR Ser Ql: REACTIVE — AB

## 2014-12-01 LAB — FLUORESCENT TREPONEMAL AB(FTA)-IGG-BLD: FLUORESCENT TREPONEMAL ABS: REACTIVE — AB

## 2014-12-01 MED ORDER — ELVITEG-COBIC-EMTRICIT-TENOFDF 150-150-200-300 MG PO TABS: ORAL_TABLET | ORAL | Status: DC

## 2014-12-03 LAB — HIV-1 RNA QUANT-NO REFLEX-BLD

## 2014-12-04 LAB — URINE CYTOLOGY ANCILLARY ONLY
Chlamydia: NEGATIVE
Neisseria Gonorrhea: NEGATIVE

## 2014-12-14 ENCOUNTER — Ambulatory Visit (INDEPENDENT_AMBULATORY_CARE_PROVIDER_SITE_OTHER): Payer: Self-pay | Admitting: Internal Medicine

## 2014-12-14 ENCOUNTER — Encounter: Payer: Self-pay | Admitting: Internal Medicine

## 2014-12-14 VITALS — BP 157/100 | HR 93 | Temp 98.2°F | Ht 66.0 in | Wt 133.5 lb

## 2014-12-14 DIAGNOSIS — B20 Human immunodeficiency virus [HIV] disease: Secondary | ICD-10-CM

## 2014-12-14 MED ORDER — ELVITEG-COBIC-EMTRICIT-TENOFAF 150-150-200-10 MG PO TABS: 1.0000 | ORAL_TABLET | Freq: Every day | ORAL | Status: DC

## 2014-12-14 NOTE — Progress Notes (Signed)
  Subjective:    Patient ID: Patrick Dunlap, male    DOB: 1990-12-28, 24 y.o.   MRN: [id number]  HPI He comes in for follow up of HIV. He was diagnosed 2014 by the health department where he also had syphilis and chlamydia. He continues on Stribild with no missed doses. CD4 count of 600 and undetectable viral load. No weight loss, no diarrhea.    Review of Systems  Constitutional: Negative for appetite change and fatigue.  HENT: Negative for trouble swallowing.   Gastrointestinal: Negative for nausea and diarrhea.  Skin: Negative for rash.  Neurological: Negative for dizziness, light-headedness and headaches.  Hematological: Negative for adenopathy.       Objective:   Physical Exam  Constitutional: He appears well-developed and well-nourished. No distress.  HENT:  Mouth/Throat: No oropharyngeal exudate.  Eyes: Right eye exhibits no discharge. Left eye exhibits no discharge. No scleral icterus.  Cardiovascular: Normal rate, regular rhythm and normal heart sounds.   No murmur heard. Pulmonary/Chest: Effort normal and breath sounds normal. No respiratory distress. He has no wheezes.  Lymphadenopathy:    He has no cervical adenopathy.  Skin: No rash noted.          Assessment & Plan:

## 2014-12-14 NOTE — Assessment & Plan Note (Signed)
Doing well on Stribild. Some mild diarrhea but no weight loss. [facility] He will come back in about 4 months and do his labs the same day as the visit as well as his drug assistance renewal.

## 2015-02-21 ENCOUNTER — Encounter (HOSPITAL_COMMUNITY): Payer: Self-pay

## 2015-02-21 ENCOUNTER — Emergency Department (HOSPITAL_COMMUNITY): Payer: Self-pay

## 2015-02-21 ENCOUNTER — Emergency Department (HOSPITAL_COMMUNITY)
Admission: EM | Admit: 2015-02-21 | Discharge: 2015-02-22 | Disposition: A | Discharge status: Home / Self Care | Payer: Self-pay | Attending: Emergency Medicine | Admitting: Emergency Medicine

## 2015-02-21 DIAGNOSIS — J069 Acute upper respiratory infection, unspecified: Secondary | ICD-10-CM | POA: Insufficient documentation

## 2015-02-21 DIAGNOSIS — J988 Other specified respiratory disorders: Secondary | ICD-10-CM

## 2015-02-21 DIAGNOSIS — B9789 Other viral agents as the cause of diseases classified elsewhere: Secondary | ICD-10-CM

## 2015-02-21 MED ORDER — HYDROCODONE-HOMATROPINE 5-1.5 MG/5ML PO SYRP
5.0000 mL | ORAL_SOLUTION | Freq: Once | ORAL | Status: AC
  Administered 2015-02-21: 5 mL via ORAL
  Filled 2015-02-21: qty 5

## 2015-02-21 MED ORDER — ACETAMINOPHEN 325 MG PO TABS
650.0000 mg | ORAL_TABLET | Freq: Once | ORAL | Status: AC
  Administered 2015-02-21: 650 mg via ORAL
  Filled 2015-02-21: qty 2

## 2015-02-21 MED ORDER — IBUPROFEN 800 MG PO TABS
800.0000 mg | ORAL_TABLET | Freq: Once | ORAL | Status: AC
Start: 2015-02-21 — End: 2015-02-21
  Administered 2015-02-21: 800 mg via ORAL
  Filled 2015-02-21: qty 1

## 2015-02-21 NOTE — ED Notes (Signed)
Pt complains of a cough, sore throat and congestion for two days

## 2015-02-21 NOTE — ED Provider Notes (Signed)
CSN: [id number]     Arrival date & time 02/21/15  2056 History   First MD Initiated Contact with Patient 02/21/15 2216     This chart was scribed for non-physician practitioner, Francee PiccoloJennifer Monserath Neff PA-C working with Tomasita CrumbleAdeleke Oni, MD by Arlan OrganAshley Leger, ED Scribe. This patient was seen in room WTR6/WTR6 and the patient's care was started at 10:37 PM.   Chief Complaint  Patient presents with  . URI   The history is provided by the patient. No language interpreter was used.    HPI Comments: Sergio Hatfield is a 24 y.o. male without any pertinent past medical history who presents to the Emergency Department complaining of possible URI x 2 days. Pt initially noted sore throat 1 day ago and and later noted a cough, congestion, chest soreness, and fever. Fever has been 102 at its highest. Sergio Hatfield has attempted OTC Mucinex and Ibuprofen without any improvement for symptoms. No recent fever, chills, nausea, vomiting, diarrhea, or otalgia. Pt denies any known sick contacts. 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 No known allergies to medications.  History reviewed. No pertinent past medical history. History reviewed. No pertinent past surgical history. Family History  Problem Relation Age of Onset  . Hypertension Other   . Diabetes Other   . Heart failure Other    History  Substance Use Topics  . Smoking status: Never Smoker   . Smokeless tobacco: Not on file  . Alcohol Use: Yes     Comment: Usually drinks 1 once a month    Review of Systems  Constitutional: Positive for fever. Negative for chills.  HENT: Positive for congestion and sore throat.   Respiratory: Positive for cough. Negative for shortness of breath.   Gastrointestinal: Negative for nausea and vomiting.  Skin: Negative for rash.  Psychiatric/Behavioral: Negative for confusion.  All other systems reviewed and are negative.   Allergies  Review of patient's allergies indicates no known allergies.  Home Medications   Prior  to Admission medications   Medication Sig Start Date End Date Taking? Authorizing Provider  acetaminophen (TYLENOL) 500 MG tablet Take 1 tablet (500 mg total) by mouth every 6 (six) hours as needed. 02/22/15   Ival Basquez, PA-C  albuterol (PROVENTIL HFA;VENTOLIN HFA) 108 (90 BASE) MCG/ACT inhaler Inhale 1-2 puffs into the lungs every 6 (six) hours as needed for wheezing or shortness of breath. 02/22/15   Khylan Sawyer, PA-C  guaiFENesin-codeine 100-10 MG/5ML syrup Take 10 mLs by mouth 3 (three) times daily as needed for cough. 02/22/15   Nuel Dejaynes, PA-C  HYDROcodone-homatropine (HYCODAN) 5-1.5 MG/5ML syrup Take 5 mLs by mouth every 6 (six) hours as needed for cough. 11/23/14   Gerhard Munchobert Lockwood, MD  ibuprofen (ADVIL,MOTRIN) 600 MG tablet Take 1 tablet (600 mg total) by mouth every 6 (six) hours as needed. 02/22/15   Francee PiccoloJennifer Denese Mentink, PA-C   Triage Vitals: BP 139/96 mmHg  Pulse 80  Temp(Src) 102.4 F (39.1 C) (Oral)  Resp 18  SpO2 99%   Physical Exam  Constitutional: He is oriented to person, place, and time. He appears well-developed and well-nourished. No distress.  HENT:  Head: Normocephalic and atraumatic.  Right Ear: External ear normal.  Left Ear: External ear normal.  Nose: Nose normal.  Mouth/Throat: Uvula is midline and mucous membranes are normal. No trismus in the jaw. No uvula swelling. Posterior oropharyngeal erythema present. No oropharyngeal exudate, posterior oropharyngeal edema or tonsillar abscesses.  Eyes: Conjunctivae are normal.  Neck: Normal range of motion. Neck supple.  No nuchal rigidity.   Cardiovascular: Normal rate, regular rhythm and normal heart sounds.   Pulmonary/Chest: Effort normal and breath sounds normal.  Abdominal: Soft. There is no tenderness.  Musculoskeletal: Normal range of motion.  Lymphadenopathy:    He has cervical adenopathy.  Neurological: He is alert and oriented to person, place, and time.  Skin: Skin is warm and dry.  He is not diaphoretic.  Psychiatric: He has a normal mood and affect.  Nursing note and vitals reviewed.   ED Course  Procedures (including critical care time) Medications  acetaminophen (TYLENOL) tablet 650 mg (650 mg Oral Given 02/21/15 2134)  HYDROcodone-homatropine (HYCODAN) 5-1.5 MG/5ML syrup 5 mL (5 mLs Oral Given 02/21/15 2251)  ibuprofen (ADVIL,MOTRIN) tablet 800 mg (800 mg Oral Given 02/21/15 2251)     DIAGNOSTIC STUDIES: Oxygen Saturation is 99% on RA, Normal by my interpretation.    COORDINATION OF CARE: 10:37 PM-Discussed treatment plan with pt at bedside and pt agreed to plan.     Labs Review Labs Reviewed  RAPID STREP SCREEN (NOT AT [facility])  CULTURE, GROUP A STREP    Imaging Review Dg Chest 2 View  02/21/2015   CLINICAL DATA:  Fever, congestion, and shortness of breath for 2 days. Upper respiratory tract infection.  EXAM: CHEST  2 VIEW  COMPARISON:  None.  FINDINGS: The cardiomediastinal contours are normal. Mild bronchial thickening. Pulmonary vasculature is normal. No consolidation, pleural effusion, or pneumothorax. No acute osseous abnormalities are seen.  IMPRESSION: Mild bronchial thickening, may reflect bronchitis. No consolidation to suggest pneumonia.   Electronically Signed   By: Rubye Oaks M.D.   On: 02/21/2015 22:01     EKG Interpretation None      MDM   Final diagnoses:  Viral respiratory illness    Filed Vitals:   02/22/15 0023  BP: 139/96  Pulse: 80  Temp:   Resp: 18   Afebrile, NAD, non-toxic appearing, AAOx4.  I have reviewed nursing notes, vital signs, and all appropriate lab and imaging results if ordered as above.   Pt CXR negative for acute infiltrate. Rapid strep negative. Patients symptoms are consistent with URI, likely viral etiology. Discussed that antibiotics are not indicated for viral infections. Pt will be discharged with symptomatic treatment.  Verbalizes understanding and is agreeable with plan. Pt is hemodynamically  stable & in NAD prior to dc.    I personally performed the services described in this documentation, which was scribed in my presence. The recorded information has been reviewed and is accurate.     Francee Piccolo, PA-C 02/22/15 5409  Tomasita Crumble, MD 02/22/15 2229

## 2015-02-22 LAB — RAPID STREP SCREEN (MED CTR MEBANE ONLY): Streptococcus, Group A Screen (Direct): NEGATIVE

## 2015-02-22 MED ORDER — ALBUTEROL SULFATE HFA 108 (90 BASE) MCG/ACT IN AERS: 1.0000 | INHALATION_SPRAY | Freq: Four times a day (QID) | RESPIRATORY_TRACT | Status: AC | PRN

## 2015-02-22 MED ORDER — ACETAMINOPHEN 500 MG PO TABS: 500.0000 mg | ORAL_TABLET | Freq: Four times a day (QID) | ORAL | Status: AC | PRN

## 2015-02-22 MED ORDER — GUAIFENESIN-CODEINE 100-10 MG/5ML PO SOLN: 10.0000 mL | Freq: Three times a day (TID) | ORAL | Status: DC | PRN

## 2015-02-22 MED ORDER — IBUPROFEN 600 MG PO TABS: 600.0000 mg | ORAL_TABLET | Freq: Four times a day (QID) | ORAL | Status: DC | PRN

## 2015-02-22 NOTE — Discharge Instructions (Signed)
Your  strep screen was negative this evening. A throat culture was sent as a precaution and results will be available in 2-3 days. If it returns positive for strep, you will be called by our flow manager for further instructions. However, at this time, it appears that your child's sore throat is caused by a viral infection. Antibiotics do NOT help a viral infection and can cause unwanted side effects. The fever should resolve in 2-3 days and sore throat should begin to resolve in 2-3 days as well. May take ibuprofen every 6hr as needed for throat pain and fever. Follow up with your doctor in 2-3 days. Return sooner for worsening symptoms, inability to swallow, breathing difficulty, new concerns.  Upper Respiratory Infection, Adult An upper respiratory infection (URI) is also sometimes known as the common cold. The upper respiratory tract includes the nose, sinuses, throat, trachea, and bronchi. Bronchi are the airways leading to the lungs. Most people improve within 1 week, but symptoms can last up to 2 weeks. A residual cough may last even longer.  CAUSES Many different viruses can infect the tissues lining the upper respiratory tract. The tissues become irritated and inflamed and often become very moist. Mucus production is also common. A cold is contagious. You can easily spread the virus to others by oral contact. This includes kissing, sharing a glass, coughing, or sneezing. Touching your mouth or nose and then touching a surface, which is then touched by another person, can also spread the virus. SYMPTOMS  Symptoms typically develop 1 to 3 days after you come in contact with a cold virus. Symptoms vary from person to person. They may include:  Runny nose.  Sneezing.  Nasal congestion.  Sinus irritation.  Sore throat.  Loss of voice (laryngitis).  Cough.  Fatigue.  Muscle aches.  Loss of appetite.  Headache.  Low-grade fever. DIAGNOSIS  You might diagnose your own cold based on  familiar symptoms, since most people get a cold 2 to 3 times a year. Your caregiver can confirm this based on your exam. Most importantly, your caregiver can check that your symptoms are not due to another disease such as strep throat, sinusitis, pneumonia, asthma, or epiglottitis. Blood tests, throat tests, and X-rays are not necessary to diagnose a common cold, but they may sometimes be helpful in excluding other more serious diseases. Your caregiver will decide if any further tests are required. RISKS AND COMPLICATIONS  You may be at risk for a more severe case of the common cold if you smoke cigarettes, have chronic heart disease (such as heart failure) or lung disease (such as asthma), or if you have a weakened immune system. The very young and very old are also at risk for more serious infections. Bacterial sinusitis, middle ear infections, and bacterial pneumonia can complicate the common cold. The common cold can worsen asthma and chronic obstructive pulmonary disease (COPD). Sometimes, these complications can require emergency medical care and may be life-threatening. PREVENTION  The best way to protect against getting a cold is to practice good hygiene. Avoid oral or hand contact with people with cold symptoms. Wash your hands often if contact occurs. There is no clear evidence that vitamin C, vitamin E, echinacea, or exercise reduces the chance of developing a cold. However, it is always recommended to get plenty of rest and practice good nutrition. TREATMENT  Treatment is directed at relieving symptoms. There is no cure. Antibiotics are not effective, because the infection is caused by a virus, not  by bacteria. Treatment may include:  Increased fluid intake. Sports drinks offer valuable electrolytes, sugars, and fluids.  Breathing heated mist or steam (vaporizer or shower).  Eating chicken soup or other clear broths, and maintaining good nutrition.  Getting plenty of rest.  Using gargles  or lozenges for comfort.  Controlling fevers with ibuprofen or acetaminophen as directed by your caregiver.  Increasing usage of your inhaler if you have asthma. Zinc gel and zinc lozenges, taken in the first 24 hours of the common cold, can shorten the duration and lessen the severity of symptoms. Pain medicines may help with fever, muscle aches, and throat pain. A variety of non-prescription medicines are available to treat congestion and runny nose. Your caregiver can make recommendations and may suggest nasal or lung inhalers for other symptoms.  HOME CARE INSTRUCTIONS   Only take over-the-counter or prescription medicines for pain, discomfort, or fever as directed by your caregiver.  Use a warm mist humidifier or inhale steam from a shower to increase air moisture. This may keep secretions moist and make it easier to breathe.  Drink enough water and fluids to keep your urine clear or pale yellow.  Rest as needed.  Return to work when your temperature has returned to normal or as your caregiver advises. You may need to stay home longer to avoid infecting others. You can also use a face mask and careful hand washing to prevent spread of the virus. SEEK MEDICAL CARE IF:   After the first few days, you feel you are getting worse rather than better.  You need your caregiver's advice about medicines to control symptoms.  You develop chills, worsening shortness of breath, or brown or red sputum. These may be signs of pneumonia.  You develop yellow or brown nasal discharge or pain in the face, especially when you bend forward. These may be signs of sinusitis.  You develop a fever, swollen neck glands, pain with swallowing, or white areas in the back of your throat. These may be signs of strep throat. SEEK IMMEDIATE MEDICAL CARE IF:   You have a fever.  You develop severe or persistent headache, ear pain, sinus pain, or chest pain.  You develop wheezing, a prolonged cough, cough up  blood, or have a change in your usual mucus (if you have chronic lung disease).  You develop sore muscles or a stiff neck. Document Released: 03/04/2001 Document Revised: 12/01/2011 Document Reviewed: 12/14/2013 [facility] Patient Information 2015 [employer] [place]. This information is not intended to replace advice given to you by your health care provider. Make sure you discuss any questions you have with your health care provider.

## 2015-02-24 LAB — CULTURE, GROUP A STREP: Strep A Culture: NEGATIVE

## 2015-04-10 ENCOUNTER — Telehealth: Payer: Self-pay | Admitting: *Deleted

## 2015-04-10 NOTE — Telephone Encounter (Signed)
Patient contacted by health department as contact to new syphilis case with neurological involvement.  They offered treatment, patient declined. States he would rather discuss treatment options with Dr. Luciana Axeomer at his office visit 8/1.  Pt (and contact) has penicillin allergy. Andree CossHowell, Emerson Schreifels M, RN

## 2015-04-11 NOTE — Telephone Encounter (Signed)
Or just give him doxycline 100 mg bid for 14 days. thanks

## 2015-04-12 ENCOUNTER — Other Ambulatory Visit: Payer: Self-pay | Admitting: *Deleted

## 2015-04-12 DIAGNOSIS — Z202 Contact with and (suspected) exposure to infections with a predominantly sexual mode of transmission: Secondary | ICD-10-CM

## 2015-04-12 MED ORDER — DOXYCYCLINE HYCLATE 100 MG PO TABS
100.0000 mg | ORAL_TABLET | Freq: Two times a day (BID) | ORAL | Status: DC
Start: 2015-04-12 — End: 2015-07-13

## 2015-04-12 NOTE — Telephone Encounter (Signed)
Notified patient, sent rx.  He will follow up 8/1.   Thanks.

## 2015-04-23 ENCOUNTER — Ambulatory Visit (INDEPENDENT_AMBULATORY_CARE_PROVIDER_SITE_OTHER): Payer: Self-pay | Admitting: Internal Medicine

## 2015-04-23 ENCOUNTER — Encounter: Payer: Self-pay | Admitting: Internal Medicine

## 2015-04-23 VITALS — BP 142/92 | HR 81 | Temp 98.2°F | Ht 66.0 in | Wt 139.0 lb

## 2015-04-23 DIAGNOSIS — Z202 Contact with and (suspected) exposure to infections with a predominantly sexual mode of transmission: Secondary | ICD-10-CM

## 2015-04-23 DIAGNOSIS — B20 Human immunodeficiency virus [HIV] disease: Secondary | ICD-10-CM

## 2015-04-23 NOTE — Assessment & Plan Note (Signed)
Doing well. No new issues. 

## 2015-04-23 NOTE — Progress Notes (Signed)
  Subjective:    Patient ID: Patrick Dunlap, male    DOB: Oct 12, 1990, 24 y.o.   MRN: [id number]  HPI He comes in for follow up of HIV. He was diagnosed 2014 by the health department where he also had syphilis and chlamydia. He continues on Genvoya with no missed doses. CD4 count of 600 last visit and undetectable viral load. No weight loss, no diarrhea. Recently seen by [facility] due to partners presumptive neurosyphilis and taking 14 days of doxycycline.     Review of Systems  Constitutional: Negative for appetite change and fatigue.  HENT: Negative for trouble swallowing.   Gastrointestinal: Negative for nausea and diarrhea.  Skin: Negative for rash.  Neurological: Negative for dizziness, light-headedness and headaches.  Hematological: Negative for adenopathy.       Objective:   Physical Exam  Constitutional: He appears well-developed and well-nourished. No distress.  HENT:  Mouth/Throat: No oropharyngeal exudate.  Eyes: Right eye exhibits no discharge. Left eye exhibits no discharge. No scleral icterus.  Cardiovascular: Normal rate, regular rhythm and normal heart sounds.   No murmur heard. Pulmonary/Chest: Effort normal and breath sounds normal. No respiratory distress. He has no wheezes.  Lymphadenopathy:    He has no cervical adenopathy.  Skin: No rash noted.     SHx: does not smoke, working.       Assessment & Plan:

## 2015-04-23 NOTE — Assessment & Plan Note (Signed)
Unclear if his partner was truly infected again or not as he has been adequately treated but took doxy just in case.

## 2015-04-25 LAB — T-HELPER CELL (CD4) - (RCID CLINIC ONLY)
CD4 % Helper T Cell: 30 % — ABNORMAL LOW (ref 33–55)
CD4 T CELL ABS: 560 /uL (ref 400–2700)

## 2015-04-25 LAB — HIV-1 RNA QUANT-NO REFLEX-BLD
HIV 1 RNA Quant: <20 copies/mL (ref <20)
HIV-1 RNA Quant, Log: <1.3 {Log} (ref <1.30)

## 2015-05-30 NOTE — Progress Notes (Signed)
Notified [facility] by fax. Keira Bohlin M, RN 

## 2015-07-12 ENCOUNTER — Telehealth: Payer: Self-pay | Admitting: *Deleted

## 2015-07-12 NOTE — Telephone Encounter (Signed)
Patient states he got a call from the health department this week advising that he be treated for syphilis.  He states he thinks this may be for a new exposure.  Patient is allergic to penicillin, was treated 03/2015 wit 2 weeks of doxycycline for a previous potential exposure. He would like to get advice from Dr. Luciana Axeomer if he needs treatment. Patient is due to follow up 11/28 with Dr. Luciana Axeomer, labs at the appointment. Please advise.  Andree CossHowell, Lason Eveland M, RN

## 2015-07-13 ENCOUNTER — Other Ambulatory Visit: Payer: Self-pay | Admitting: *Deleted

## 2015-07-13 DIAGNOSIS — Z202 Contact with and (suspected) exposure to infections with a predominantly sexual mode of transmission: Secondary | ICD-10-CM

## 2015-07-13 MED ORDER — DOXYCYCLINE HYCLATE 100 MG PO TABS: 100.0000 mg | ORAL_TABLET | Freq: Two times a day (BID) | ORAL | Status: DC

## 2015-07-13 NOTE — Telephone Encounter (Signed)
Yes, treat.  Doxy 100 mg twice a day for 14 days. thanks

## 2015-07-13 NOTE — Telephone Encounter (Signed)
Rx sent, patient aware.  Thanks!

## 2015-08-20 ENCOUNTER — Ambulatory Visit: Payer: Self-pay | Admitting: Internal Medicine

## 2015-09-27 ENCOUNTER — Encounter: Payer: Self-pay | Admitting: Internal Medicine

## 2015-09-27 ENCOUNTER — Ambulatory Visit (INDEPENDENT_AMBULATORY_CARE_PROVIDER_SITE_OTHER): Payer: Self-pay | Admitting: Internal Medicine

## 2015-09-27 VITALS — BP 154/96 | HR 108 | Temp 98.5°F | Wt 144.0 lb

## 2015-09-27 DIAGNOSIS — Z79899 Other long term (current) drug therapy: Secondary | ICD-10-CM

## 2015-09-27 DIAGNOSIS — Z113 Encounter for screening for infections with a predominantly sexual mode of transmission: Secondary | ICD-10-CM

## 2015-09-27 DIAGNOSIS — B20 Human immunodeficiency virus [HIV] disease: Secondary | ICD-10-CM

## 2015-09-27 LAB — CBC WITH DIFFERENTIAL/PLATELET
Basophils Absolute: 0 10*3/uL (ref 0.0–0.1)
Basophils Relative: 0 % (ref 0–1)
Eosinophils Absolute: 0 10*3/uL (ref 0.0–0.7)
Eosinophils Relative: 0 % (ref 0–5)
HCT: 42.3 % (ref 39.0–52.0)
Hemoglobin: 14.9 g/dL (ref 13.0–17.0)
LYMPHS ABS: 2.2 10*3/uL (ref 0.7–4.0)
Lymphocytes Relative: 23 % (ref 12–46)
MCH: 30.4 pg (ref 26.0–34.0)
MCHC: 35.2 g/dL (ref 30.0–36.0)
MCV: 86.3 fL (ref 78.0–100.0)
MONOS PCT: 7 % (ref 3–12)
MPV: 9.8 fL (ref 8.6–12.4)
Monocytes Absolute: 0.7 10*3/uL (ref 0.1–1.0)
Neutro Abs: 6.7 10*3/uL (ref 1.7–7.7)
Neutrophils Relative %: 70 % (ref 43–77)
PLATELETS: 319 10*3/uL (ref 150–400)
RBC: 4.9 MIL/uL (ref 4.22–5.81)
RDW: 13.3 % (ref 11.5–15.5)
WBC: 9.5 10*3/uL (ref 4.0–10.5)

## 2015-09-27 LAB — COMPLETE METABOLIC PANEL WITH GFR
ALT: 14 U/L (ref 9–46)
AST: 27 U/L (ref 10–40)
Albumin: 4.7 g/dL (ref 3.6–5.1)
Alkaline Phosphatase: 38 U/L — ABNORMAL LOW (ref 40–115)
BILIRUBIN TOTAL: 0.6 mg/dL (ref 0.2–1.2)
BUN: 19 mg/dL (ref 7–25)
CO2: 31 mmol/L (ref 20–31)
Calcium: 10.2 mg/dL (ref 8.6–10.3)
Chloride: 102 mmol/L (ref 98–110)
Creat: 1.44 mg/dL — ABNORMAL HIGH (ref 0.60–1.35)
GFR, Est African American: 78 mL/min (ref >=60)
GFR, Est Non African American: 67 mL/min (ref >=60)
GLUCOSE: 83 mg/dL (ref 65–99)
Potassium: 4.1 mmol/L (ref 3.5–5.3)
SODIUM: 142 mmol/L (ref 135–146)
TOTAL PROTEIN: 7.5 g/dL (ref 6.1–8.1)

## 2015-09-27 LAB — LIPID PANEL
CHOL/HDL RATIO: 3.6 ratio (ref <=5.0)
Cholesterol: 164 mg/dL (ref 125–200)
HDL: 45 mg/dL (ref >=40)
LDL Cholesterol: 103 mg/dL (ref <130)
Triglycerides: 80 mg/dL (ref <150)
VLDL: 16 mg/dL (ref <30)

## 2015-09-27 NOTE — Assessment & Plan Note (Signed)
Rectal swab and urine.

## 2015-09-27 NOTE — Progress Notes (Signed)
  Subjective:    Patient ID: Patrick Dunlap, male    DOB: June 26, 1991, 25 y.o.   MRN: [id number]  HPI He comes in for follow up of HIV.    He continues on Genvoya with no missed doses. CD4 count of 560 last visit and undetectable viral load. No weight loss, no diarrhea. Completed treatment for presumptive syphilis.  Occasional sexual activity and uses condoms.  Does endorse receptive anal intercourse.     Review of Systems  Constitutional: Negative for appetite change and fatigue.  HENT: Negative for trouble swallowing.   Gastrointestinal: Negative for nausea and diarrhea.  Skin: Negative for rash.  Neurological: Negative for dizziness, light-headedness and headaches.  Hematological: Negative for adenopathy.       Objective:   Physical Exam  Constitutional: He appears well-developed and well-nourished. No distress.  HENT:  Mouth/Throat: No oropharyngeal exudate.  Eyes: Right eye exhibits no discharge. Left eye exhibits no discharge. No scleral icterus.  Cardiovascular: Normal rate, regular rhythm and normal heart sounds.   No murmur heard. Pulmonary/Chest: Effort normal and breath sounds normal. No respiratory distress. He has no wheezes.  Lymphadenopathy:    He has no cervical adenopathy.  Skin: No rash noted.     SHx: does not smoke, working.       Assessment & Plan:

## 2015-09-27 NOTE — Assessment & Plan Note (Signed)
Doing well, labs today and rtc 6 months unless concerns.  

## 2015-09-28 LAB — T-HELPER CELL (CD4) - (RCID CLINIC ONLY)
CD4 % Helper T Cell: 32 % — ABNORMAL LOW (ref 33–55)
CD4 T CELL ABS: 730 /uL (ref 400–2700)

## 2015-09-28 LAB — RPR

## 2015-10-01 LAB — URINE CYTOLOGY ANCILLARY ONLY
CHLAMYDIA, DNA PROBE: NEGATIVE
NEISSERIA GONORRHEA: NEGATIVE

## 2015-10-01 LAB — CYTOLOGY, (ORAL, ANAL, URETHRAL) ANCILLARY ONLY
Chlamydia: NEGATIVE
Neisseria Gonorrhea: NEGATIVE

## 2015-10-02 LAB — HIV-1 RNA QUANT-NO REFLEX-BLD
HIV 1 RNA Quant: <20 copies/mL (ref <20)
HIV-1 RNA Quant, Log: <1.3 Log copies/mL (ref <1.30)

## 2015-10-03 ENCOUNTER — Ambulatory Visit: Payer: Self-pay

## 2015-10-20 ENCOUNTER — Other Ambulatory Visit: Payer: Self-pay | Admitting: Internal Medicine

## 2015-10-29 ENCOUNTER — Other Ambulatory Visit: Payer: Self-pay | Admitting: *Deleted

## 2015-10-29 DIAGNOSIS — B20 Human immunodeficiency virus [HIV] disease: Secondary | ICD-10-CM

## 2015-10-29 MED ORDER — ELVITEG-COBIC-EMTRICIT-TENOFAF 150-150-200-10 MG PO TABS: 1.0000 | ORAL_TABLET | Freq: Every day | ORAL | Status: DC

## 2015-10-29 NOTE — Progress Notes (Signed)
Rx sent to [facility]

## 2015-11-13 ENCOUNTER — Encounter: Payer: Self-pay | Admitting: *Deleted

## 2016-01-09 ENCOUNTER — Encounter (HOSPITAL_COMMUNITY): Payer: Self-pay | Admitting: *Deleted

## 2016-01-09 ENCOUNTER — Emergency Department (HOSPITAL_COMMUNITY)
Admission: EM | Admit: 2016-01-09 | Discharge: 2016-01-09 | Disposition: A | Discharge status: Home / Self Care | Payer: Self-pay | Attending: Emergency Medicine | Admitting: Emergency Medicine

## 2016-01-09 DIAGNOSIS — Z0289 Encounter for other administrative examinations: Secondary | ICD-10-CM | POA: Insufficient documentation

## 2016-01-09 DIAGNOSIS — S3992XA Unspecified injury of lower back, initial encounter: Secondary | ICD-10-CM | POA: Insufficient documentation

## 2016-01-09 DIAGNOSIS — W07XXXA Fall from chair, initial encounter: Secondary | ICD-10-CM | POA: Insufficient documentation

## 2016-01-09 DIAGNOSIS — Y990 Civilian activity done for income or pay: Secondary | ICD-10-CM | POA: Insufficient documentation

## 2016-01-09 DIAGNOSIS — Y9389 Activity, other specified: Secondary | ICD-10-CM | POA: Insufficient documentation

## 2016-01-09 DIAGNOSIS — M791 Myalgia, unspecified site: Secondary | ICD-10-CM

## 2016-01-09 DIAGNOSIS — W19XXXA Unspecified fall, initial encounter: Secondary | ICD-10-CM

## 2016-01-09 DIAGNOSIS — Y9289 Other specified places as the place of occurrence of the external cause: Secondary | ICD-10-CM | POA: Insufficient documentation

## 2016-01-09 DIAGNOSIS — S199XXA Unspecified injury of neck, initial encounter: Secondary | ICD-10-CM | POA: Insufficient documentation

## 2016-01-09 DIAGNOSIS — Z79899 Other long term (current) drug therapy: Secondary | ICD-10-CM | POA: Insufficient documentation

## 2016-01-09 MED ORDER — IBUPROFEN 600 MG PO TABS: 600.0000 mg | ORAL_TABLET | Freq: Four times a day (QID) | ORAL | Status: DC | PRN

## 2016-01-09 MED ORDER — CYCLOBENZAPRINE HCL 10 MG PO TABS: 5.0000 mg | ORAL_TABLET | Freq: Two times a day (BID) | ORAL | Status: DC | PRN
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
## 2016-01-09 NOTE — ED Notes (Signed)
Patient was alert, oriented and stable upon discharge. RN went over AVS and patient had no further questions.  

## 2016-01-09 NOTE — Discharge Instructions (Signed)
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
## 2016-03-20 ENCOUNTER — Ambulatory Visit: Payer: Self-pay | Admitting: Internal Medicine

## 2016-03-26 ENCOUNTER — Ambulatory Visit: Payer: Self-pay | Admitting: Internal Medicine

## 2016-04-02 ENCOUNTER — Ambulatory Visit: Payer: Self-pay

## 2016-04-02 ENCOUNTER — Ambulatory Visit (INDEPENDENT_AMBULATORY_CARE_PROVIDER_SITE_OTHER): Payer: Self-pay | Admitting: Internal Medicine

## 2016-04-02 ENCOUNTER — Encounter: Payer: Self-pay | Admitting: Internal Medicine

## 2016-04-02 VITALS — BP 149/98 | HR 89 | Temp 98.9°F | Ht 67.0 in | Wt 143.0 lb

## 2016-04-02 DIAGNOSIS — Z202 Contact with and (suspected) exposure to infections with a predominantly sexual mode of transmission: Secondary | ICD-10-CM

## 2016-04-02 DIAGNOSIS — B20 Human immunodeficiency virus [HIV] disease: Secondary | ICD-10-CM

## 2016-04-02 NOTE — Progress Notes (Signed)
  Subjective:    Patient ID: Patrick StallChristian Dubray, male    DOB: 1991-04-13, 25 y.o.   MRN: [id number]  HPI He comes in for follow up of HIV.    He continues on Genvoya with no missed doses. CD4 count of 730 last visit and undetectable viral load. No weight loss, no diarrhea. Occasional sexual activity and uses condoms.  Does endorse receptive anal intercourse and anal and oral swabs last visit were negative.     Review of Systems  Constitutional: Negative for appetite change and fatigue.  HENT: Negative for trouble swallowing.   Gastrointestinal: Negative for nausea and diarrhea.  Skin: Negative for rash.  Neurological: Negative for dizziness, light-headedness and headaches.  Hematological: Negative for adenopathy.       Objective:   Physical Exam  Constitutional: He appears well-developed and well-nourished. No distress.  HENT:  Mouth/Throat: No oropharyngeal exudate.  Eyes: Right eye exhibits no discharge. Left eye exhibits no discharge. No scleral icterus.  Cardiovascular: Normal rate, regular rhythm and normal heart sounds.   No murmur heard. Pulmonary/Chest: Effort normal and breath sounds normal. No respiratory distress. He has no wheezes.  Lymphadenopathy:    He has no cervical adenopathy.  Skin: No rash noted.     SHx: does not smoke, working.       Assessment & Plan:

## 2016-04-02 NOTE — Assessment & Plan Note (Signed)
Doing great.  rtc 6 months.  

## 2016-04-02 NOTE — Assessment & Plan Note (Signed)
rpr was negative last visit.  Will continue to monitor regularly.

## 2016-04-04 ENCOUNTER — Encounter: Payer: Self-pay | Admitting: Internal Medicine

## 2016-04-04 LAB — HIV-1 RNA QUANT-NO REFLEX-BLD
HIV 1 RNA Quant: <20 copies/mL (ref <20)
HIV-1 RNA Quant, Log: <1.3 Log copies/mL (ref <1.30)

## 2016-04-04 LAB — T-HELPER CELL (CD4) - (RCID CLINIC ONLY)
CD4 % Helper T Cell: 29 % — ABNORMAL LOW (ref 33–55)
CD4 T CELL ABS: 520 /uL (ref 400–2700)

## 2016-04-12 ENCOUNTER — Other Ambulatory Visit: Payer: Self-pay | Admitting: Internal Medicine

## 2016-07-11 ENCOUNTER — Other Ambulatory Visit: Payer: Self-pay | Admitting: Internal Medicine

## 2016-07-11 DIAGNOSIS — B20 Human immunodeficiency virus [HIV] disease: Secondary | ICD-10-CM

## 2016-07-12 ENCOUNTER — Emergency Department (HOSPITAL_COMMUNITY)
Admission: EM | Admit: 2016-07-12 | Discharge: 2016-07-12 | Disposition: A | Discharge status: Home / Self Care | Payer: Self-pay | Attending: Emergency Medicine | Admitting: Emergency Medicine

## 2016-07-12 ENCOUNTER — Emergency Department (HOSPITAL_COMMUNITY): Payer: Self-pay

## 2016-07-12 ENCOUNTER — Encounter (HOSPITAL_COMMUNITY): Payer: Self-pay | Admitting: *Deleted

## 2016-07-12 DIAGNOSIS — R51 Headache: Secondary | ICD-10-CM | POA: Insufficient documentation

## 2016-07-12 DIAGNOSIS — R519 Headache, unspecified: Secondary | ICD-10-CM

## 2016-07-12 MED ORDER — METOCLOPRAMIDE HCL 5 MG/ML IJ SOLN
10.0000 mg | Freq: Once | INTRAMUSCULAR | Status: AC
  Administered 2016-07-12: 10 mg via INTRAVENOUS
  Filled 2016-07-12: qty 2

## 2016-07-12 MED ORDER — DIPHENHYDRAMINE HCL 50 MG/ML IJ SOLN
25.0000 mg | Freq: Once | INTRAMUSCULAR | Status: AC
  Administered 2016-07-12: 25 mg via INTRAVENOUS
  Filled 2016-07-12: qty 1

## 2016-07-12 NOTE — ED Provider Notes (Signed)
[facility]-EMERGENCY DEPT Provider Note   CSN: [id number] Arrival date & time: 07/12/16  1248     History   Chief Complaint Chief Complaint  Patient presents with  . Headache  . Nausea    HPI Sergio Hatfield is a 25 y.o. male.  Patient with history of headache presents with the same today. Patient does not take any medications to prevent headaches. Over the past 3-4 weeks, patient has had a persistent headache behind his eyes. This has been associated with photophobia at times. Headache is constant and never goes away. Previous headaches were less severe and lasted no longer than a day. Patient has had nausea but no vomiting. At times he feels dizziness and blurry vision however these are short-lived. No current symptoms similar to this. No weakness in arms or legs. No difficulty with ambulation. No slurred speech or facial droop. Patient has tried over-the-counter medications and pain medication he had for his back without relief. Patient denies fevers or neck pain. He denies sinus pain or dental pain. He denies head injury or trauma. The onset of this condition was acute. The course is constant. Aggravating factors: none. Alleviating factors: none.        History reviewed. No pertinent past medical history.  Patient Active Problem List   Diagnosis Date Noted  . Headache(784.0) 11/01/2012  . Fever 11/01/2012  . OBESITY, NOS 11/19/2006    History reviewed. No pertinent surgical history.     Home Medications    Prior to Admission medications   Medication Sig Start Date End Date Taking? Authorizing Provider  acetaminophen (TYLENOL) 500 MG tablet Take 1 tablet (500 mg total) by mouth every 6 (six) hours as needed. 02/22/15  Yes Jennifer Piepenbrink, PA-C  albuterol (PROVENTIL HFA;VENTOLIN HFA) 108 (90 BASE) MCG/ACT inhaler Inhale 1-2 puffs into the lungs every 6 (six) hours as needed for wheezing or shortness of breath. 02/22/15  Yes Francee PiccoloJennifer Piepenbrink, PA-C    Family  History Family History  Problem Relation Age of Onset  . Hypertension Other   . Diabetes Other   . Heart failure Other     Social History Social History  Substance Use Topics  . Smoking status: Never Smoker  . Smokeless tobacco: Never Used  . Alcohol use Yes     Comment: Usually drinks 1 once a month     Allergies   Review of patient's allergies indicates no known allergies.   Review of Systems Review of Systems  Constitutional: Negative for fever.  HENT: Negative for congestion, dental problem, rhinorrhea and sinus pressure.   Eyes: Positive for photophobia and visual disturbance. Negative for discharge and redness.  Respiratory: Negative for shortness of breath.   Cardiovascular: Negative for chest pain.  Gastrointestinal: Negative for nausea and vomiting.  Musculoskeletal: Negative for gait problem, neck pain and neck stiffness.  Skin: Negative for rash.  Neurological: Positive for dizziness and headaches. Negative for syncope, speech difficulty, weakness, light-headedness and numbness.  Psychiatric/Behavioral: Negative for confusion.     Physical Exam Updated Vital Signs BP 127/71 (BP Location: Left Arm)   Pulse 70   Temp 98.7 F (37.1 C) (Oral)   Resp 18   SpO2 98%   Physical Exam  Constitutional: He is oriented to person, place, and time. He appears well-developed and well-nourished.  HENT:  Head: Normocephalic and atraumatic.  Right Ear: Tympanic membrane, external ear and ear canal normal.  Left Ear: Tympanic membrane, external ear and ear canal normal.  Nose: Nose normal.  Mouth/Throat:  Uvula is midline, oropharynx is clear and moist and mucous membranes are normal.  Eyes: Conjunctivae, EOM and lids are normal. Pupils are equal, round, and reactive to light.  Neck: Normal range of motion. Neck supple.  Cardiovascular: Normal rate and regular rhythm.   Pulmonary/Chest: Effort normal and breath sounds normal.  Abdominal: Soft. There is no tenderness.    Musculoskeletal: Normal range of motion.       Cervical back: He exhibits normal range of motion, no tenderness and no bony tenderness.  Neurological: He is alert and oriented to person, place, and time. He has normal strength and normal reflexes. No cranial nerve deficit or sensory deficit. He exhibits normal muscle tone. He displays a negative Romberg sign. Coordination and gait normal. GCS eye subscore is 4. GCS verbal subscore is 5. GCS motor subscore is 6.  Skin: Skin is warm and dry.  Psychiatric: He has a normal mood and affect.  Nursing note and vitals reviewed.    ED Treatments / Results   Radiology Ct Head Wo Contrast  Result Date: 07/12/2016 CLINICAL DATA:  Headache for several weeks EXAM: CT HEAD WITHOUT CONTRAST TECHNIQUE: Contiguous axial images were obtained from the base of the skull through the vertex without intravenous contrast. COMPARISON:  10/31/2012 FINDINGS: Brain: No evidence of acute infarction, hemorrhage, hydrocephalus, extra-axial collection or mass lesion/mass effect. Vascular: No hyperdense vessel or unexpected calcification. Skull: Normal. Negative for fracture or focal lesion. Sinuses/Orbits: No acute finding. Other: None. IMPRESSION: No acute abnormality noted. Electronically Signed   By: Alcide Clever M.D.   On: 07/12/2016 14:33    Procedures Procedures (including critical care time)  Medications Ordered in ED Medications  metoCLOPramide (REGLAN) injection 10 mg (10 mg Intravenous Given 07/12/16 1459)  diphenhydrAMINE (BENADRYL) injection 25 mg (25 mg Intravenous Given 07/12/16 1459)     Initial Impression / Assessment and Plan / ED Course  I have reviewed the triage vital signs and the nursing notes.  Pertinent labs & imaging results that were available during my care of the patient were reviewed by me and considered in my medical decision making (see chart for details).  Clinical Course   Patient seen and examined. Work-up initiated. Medications  ordered.   Vital signs reviewed and are as follows: BP 127/71 (BP Location: Left Arm)   Pulse 70   Temp 98.7 F (37.1 C) (Oral)   Resp 18   SpO2 98%   Head CT negative, patient informed. He is feeling better at time of discharge. He wishes to go home. I encouraged PCP follow-up for further evaluation if not improving. Patient counseled to return if they have weakness in their arms or legs, slurred speech, trouble walking or talking, confusion, trouble with their balance, or if they have any other concerns. Patient verbalizes understanding and agrees with plan.    Final Clinical Impressions(s) / ED Diagnoses   Final diagnoses:  Nonintractable headache, unspecified chronicity pattern, unspecified headache type   Patient with history of headaches, but with atypical headache today and both duration and severity. Patient without high-risk features of headache including: sudden onset/thunderclap HA, no similar headache in past, altered mental status, accompanying seizure, headache with exertion, age > 41, history of immunocompromise, neck or shoulder pain, fever, use of anticoagulation, family history of spontaneous SAH, concomitant drug use, toxic exposure.   Patient has a normal complete neurological exam, normal vital signs, normal level of consciousness, no signs of meningismus, is well-appearing/non-toxic appearing, no signs of trauma.   CT performed  given atypical headache and is normal.  No dangerous or life-threatening conditions suspected or identified by history, physical exam, and by work-up. No indications for hospitalization identified.     New Prescriptions Discharge Medication List as of 07/12/2016  4:06 PM       Renne Crigler, PA-C 07/12/16 1631    Nelva Nay, MD 07/13/16 [phone]

## 2016-07-12 NOTE — Discharge Instructions (Signed)
Please read and follow all provided instructions.  Your diagnoses today include:  1. Nonintractable headache, unspecified chronicity pattern, unspecified headache type     Tests performed today include:  CT of your head which was normal and did not show any serious cause of your headache  Vital signs. See below for your results today.   Medications:  In the Emergency Department you received:  Reglan - antinausea/headache medication  Benadryl - antihistamine to counteract potential side effects of reglan  Take any prescribed medications only as directed.  Additional information:  Follow any educational materials contained in this packet.  You are having a headache. No specific cause was found today for your headache. It may have been a migraine or other cause of headache. Stress, anxiety, fatigue, and depression are common triggers for headaches.   Your headache today does not appear to be life-threatening or require hospitalization, but often the exact cause of headaches is not determined in the emergency department. Therefore, follow-up with your doctor is very important to find out what may have caused your headache and whether or not you need any further diagnostic testing or treatment.   Sometimes headaches can appear benign (not harmful), but then more serious symptoms can develop which should prompt an immediate re-evaluation by your doctor or the emergency department.  BE VERY CAREFUL not to take multiple medicines containing Tylenol (also called acetaminophen). Doing so can lead to an overdose which can damage your liver and cause liver failure and possibly death.   Follow-up instructions: Please follow-up with your primary care provider in the next 3 days for further evaluation of your symptoms.   Return instructions:   Please return to the Emergency Department if you experience worsening symptoms.  Return if the medications do not resolve your headache, if it recurs,  or if you have multiple episodes of vomiting or cannot keep down fluids.  Return if you have a change from the usual headache.  RETURN IMMEDIATELY IF you:  Develop a sudden, severe headache  Develop confusion or become poorly responsive or faint  Develop a fever above 100.83F or problem breathing  Have a change in speech, vision, swallowing, or understanding  Develop new weakness, numbness, tingling, incoordination in your arms or legs  Have a seizure  Please return if you have any other emergent concerns.  Additional Information:  Your vital signs today were: BP 127/71 (BP Location: Left Arm)    Pulse 70    Temp 98.7 F (37.1 C) (Oral)    Resp 18    SpO2 98%  If your blood pressure (BP) was elevated above 135/85 this visit, please have this repeated by your doctor within one month. --------------

## 2016-07-12 NOTE — ED Triage Notes (Signed)
Pt complains of headache for the past 3-4 weeks. Pt states he began feeling nausea, dizziness and had blurred vision for the past few days.

## 2016-10-01 ENCOUNTER — Ambulatory Visit: Payer: Self-pay

## 2016-10-01 ENCOUNTER — Ambulatory Visit (INDEPENDENT_AMBULATORY_CARE_PROVIDER_SITE_OTHER): Payer: Self-pay | Admitting: Internal Medicine

## 2016-10-01 ENCOUNTER — Encounter: Payer: Self-pay | Admitting: Internal Medicine

## 2016-10-01 VITALS — BP 158/108 | HR 79 | Temp 98.2°F | Wt 146.0 lb

## 2016-10-01 DIAGNOSIS — Z5181 Encounter for therapeutic drug level monitoring: Secondary | ICD-10-CM | POA: Insufficient documentation

## 2016-10-01 DIAGNOSIS — Z23 Encounter for immunization: Secondary | ICD-10-CM

## 2016-10-01 DIAGNOSIS — Z79899 Other long term (current) drug therapy: Secondary | ICD-10-CM

## 2016-10-01 DIAGNOSIS — B20 Human immunodeficiency virus [HIV] disease: Secondary | ICD-10-CM

## 2016-10-01 DIAGNOSIS — Z113 Encounter for screening for infections with a predominantly sexual mode of transmission: Secondary | ICD-10-CM

## 2016-10-01 LAB — COMPLETE METABOLIC PANEL WITH GFR
ALT: 21 U/L (ref 9–46)
AST: 32 U/L (ref 10–40)
Albumin: 4.6 g/dL (ref 3.6–5.1)
Alkaline Phosphatase: 43 U/L (ref 40–115)
BUN: 20 mg/dL (ref 7–25)
CALCIUM: 10 mg/dL (ref 8.6–10.3)
CHLORIDE: 104 mmol/L (ref 98–110)
CO2: 27 mmol/L (ref 20–31)
Creat: 1.1 mg/dL (ref 0.60–1.35)
GFR, Est Non African American: >89 mL/min (ref >=60)
Glucose, Bld: 88 mg/dL (ref 65–99)
POTASSIUM: 4 mmol/L (ref 3.5–5.3)
Sodium: 140 mmol/L (ref 135–146)
Total Bilirubin: 0.7 mg/dL (ref 0.2–1.2)
Total Protein: 7.7 g/dL (ref 6.1–8.1)

## 2016-10-01 LAB — CBC WITH DIFFERENTIAL/PLATELET
Basophils Absolute: 66 cells/uL (ref 0–200)
Basophils Relative: 1 %
Eosinophils Absolute: 132 cells/uL (ref 15–500)
Eosinophils Relative: 2 %
HEMATOCRIT: 46.4 % (ref 38.5–50.0)
HEMOGLOBIN: 15.7 g/dL (ref 13.2–17.1)
LYMPHS ABS: 1716 {cells}/uL (ref 850–3900)
LYMPHS PCT: 26 %
MCH: 30.3 pg (ref 27.0–33.0)
MCHC: 33.8 g/dL (ref 32.0–36.0)
MCV: 89.6 fL (ref 80.0–100.0)
MONO ABS: 528 {cells}/uL (ref 200–950)
MPV: 10 fL (ref 7.5–12.5)
Monocytes Relative: 8 %
Neutro Abs: 4158 cells/uL (ref 1500–7800)
Neutrophils Relative %: 63 %
Platelets: 276 10*3/uL (ref 140–400)
RBC: 5.18 MIL/uL (ref 4.20–5.80)
RDW: 13.5 % (ref 11.0–15.0)
WBC: 6.6 10*3/uL (ref 3.8–10.8)

## 2016-10-01 LAB — LIPID PANEL
Cholesterol: 187 mg/dL (ref <200)
HDL: 65 mg/dL (ref >40)
LDL CALC: 113 mg/dL — AB (ref <100)
TRIGLYCERIDES: 47 mg/dL (ref <150)
Total CHOL/HDL Ratio: 2.9 Ratio (ref <5.0)
VLDL: 9 mg/dL (ref <30)

## 2016-10-01 NOTE — Progress Notes (Signed)
  Subjective:    Patient ID: Patrick Dunlap, male    DOB: [birth year], 26 y.o.   MRN: [id number]  HPI He comes in for follow up of HIV.    He continues on Genvoya with no missed doses. CD4 count of 520 last visit and undetectable viral load. No weight loss, no diarrhea. Occasional sexual activity and uses condoms.  No penile discharge, no warts.  Some itching occasionally on arms but no rash.   No associated n/v/d.    Review of Systems  Constitutional: Negative for appetite change and fatigue.  HENT: Negative for trouble swallowing.   Gastrointestinal: Negative for diarrhea and nausea.  Skin: Negative for rash.  Neurological: Negative for dizziness, light-headedness and headaches.  Hematological: Negative for adenopathy.       Objective:   Physical Exam  Constitutional: He appears well-developed and well-nourished. No distress.  HENT:  Mouth/Throat: No oropharyngeal exudate.  Eyes: Right eye exhibits no discharge. Left eye exhibits no discharge. No scleral icterus.  Cardiovascular: Normal rate, regular rhythm and normal heart sounds.   No murmur heard. Pulmonary/Chest: Effort normal and breath sounds normal. No respiratory distress. He has no wheezes.  Lymphadenopathy:    He has no cervical adenopathy.  Skin: No rash noted.     SH: no drug use      Assessment & Plan:

## 2016-10-01 NOTE — Assessment & Plan Note (Signed)
Will screen today 

## 2016-10-01 NOTE — Assessment & Plan Note (Signed)
Will check lipids. 

## 2016-10-01 NOTE — Assessment & Plan Note (Signed)
Will check creat, LFTs

## 2016-10-01 NOTE — Assessment & Plan Note (Signed)
Doing well.  Labs today and rtc 6 months unless concerns.  

## 2016-10-02 LAB — RPR

## 2016-10-02 LAB — T-HELPER CELL (CD4) - (RCID CLINIC ONLY)
CD4 % Helper T Cell: 31 % — ABNORMAL LOW (ref 33–55)
CD4 T Cell Abs: 580 /uL (ref 400–2700)

## 2016-10-03 LAB — HIV-1 RNA QUANT-NO REFLEX-BLD
HIV 1 RNA QUANT: 34 {copies}/mL — AB (ref <20)
HIV-1 RNA Quant, Log: 1.53 Log copies/mL — ABNORMAL HIGH (ref <1.30)

## 2016-10-17 ENCOUNTER — Encounter: Payer: Self-pay | Admitting: Internal Medicine

## 2016-12-12 ENCOUNTER — Other Ambulatory Visit: Payer: Self-pay | Admitting: Internal Medicine

## 2016-12-12 DIAGNOSIS — B20 Human immunodeficiency virus [HIV] disease: Secondary | ICD-10-CM

## 2017-01-02 ENCOUNTER — Ambulatory Visit (INDEPENDENT_AMBULATORY_CARE_PROVIDER_SITE_OTHER): Payer: Self-pay

## 2017-01-02 ENCOUNTER — Other Ambulatory Visit: Payer: Self-pay

## 2017-01-02 DIAGNOSIS — Z202 Contact with and (suspected) exposure to infections with a predominantly sexual mode of transmission: Secondary | ICD-10-CM

## 2017-01-02 MED ORDER — AZITHROMYCIN 250 MG PO TABS
1000.0000 mg | ORAL_TABLET | Freq: Once | ORAL | Status: AC
  Administered 2017-01-02: 1000 mg via ORAL

## 2017-01-02 MED ORDER — CEFTRIAXONE SODIUM 250 MG IJ SOLR
250.0000 mg | Freq: Once | INTRAMUSCULAR | Status: AC
  Administered 2017-01-02: 250 mg via INTRAMUSCULAR

## 2017-01-02 NOTE — Progress Notes (Signed)
Patient walked into clinic with male partner who recently tested positive for gonorrhea.  Currently he is asymptomatic.  Partners labs verified via epic.   Pradeep treated with azithromycin 1000 mg and Rocephin 250 mg per Dr Luciana Axe.   Laurell Josephs, RN

## 2017-03-23 ENCOUNTER — Ambulatory Visit: Payer: Self-pay

## 2017-03-23 ENCOUNTER — Encounter: Payer: Self-pay | Admitting: Internal Medicine

## 2017-03-23 ENCOUNTER — Ambulatory Visit (INDEPENDENT_AMBULATORY_CARE_PROVIDER_SITE_OTHER): Payer: Self-pay | Admitting: Internal Medicine

## 2017-03-23 VITALS — BP 150/95 | HR 102 | Temp 98.3°F | Ht 67.0 in | Wt 155.0 lb

## 2017-03-23 DIAGNOSIS — Z7185 Encounter for immunization safety counseling: Secondary | ICD-10-CM

## 2017-03-23 DIAGNOSIS — Z23 Encounter for immunization: Secondary | ICD-10-CM | POA: Insufficient documentation

## 2017-03-23 DIAGNOSIS — Z7189 Other specified counseling: Secondary | ICD-10-CM

## 2017-03-23 DIAGNOSIS — B20 Human immunodeficiency virus [HIV] disease: Secondary | ICD-10-CM

## 2017-03-23 NOTE — Progress Notes (Signed)
  Subjective:    Patient ID: Patrick Dunlap, male    DOB: 11/02/1990, 26 y.o.   MRN: [id number]  HPI He comes in for follow up of HIV.    He continues on Genvoya with no missed doses. CD4 count of 580 last visit and viral load just 34. No weight loss, no diarrhea. Occasional sexual activity and uses condoms.  No penile discharge, no warts.  No associated n/v/d.  No questions today.   Review of Systems  Gastrointestinal: Negative for diarrhea and nausea.  Skin: Negative for rash.  Neurological: Negative for dizziness, light-headedness and headaches.       Objective:   Physical Exam  Constitutional: He appears well-developed and well-nourished. No distress.  HENT:  Mouth/Throat: No oropharyngeal exudate.  Eyes: Right eye exhibits no discharge. Left eye exhibits no discharge. No scleral icterus.  Cardiovascular: Normal rate, regular rhythm and normal heart sounds.   No murmur heard. Pulmonary/Chest: Effort normal and breath sounds normal. No respiratory distress. He has no wheezes.  Lymphadenopathy:    He has no cervical adenopathy.  Skin: No rash noted.     SH: no drug use      Assessment & Plan:

## 2017-03-23 NOTE — Assessment & Plan Note (Signed)
Will check labs today. rtc 6 months unless concerns

## 2017-03-23 NOTE — Addendum Note (Signed)
Addended by: Wendall MolaOCKERHAM, JACQUELINE A on: 03/23/2017 04:47 PM   Modules accepted: Orders

## 2017-03-23 NOTE — Assessment & Plan Note (Signed)
Discussed Menveo, will get today

## 2017-03-24 LAB — T-HELPER CELL (CD4) - (RCID CLINIC ONLY)
CD4 T CELL ABS: 540 /uL (ref 400–2700)
CD4 T CELL HELPER: 30 % — AB (ref 33–55)

## 2017-03-26 LAB — HIV-1 RNA QUANT-NO REFLEX-BLD
HIV 1 RNA QUANT: DETECTED {copies}/mL — AB
HIV-1 RNA Quant, Log: <1.3 Log copies/mL — AB

## 2017-03-31 ENCOUNTER — Encounter: Payer: Self-pay | Admitting: Internal Medicine

## 2017-06-26 ENCOUNTER — Other Ambulatory Visit: Payer: Self-pay | Admitting: Internal Medicine

## 2017-06-26 DIAGNOSIS — B20 Human immunodeficiency virus [HIV] disease: Secondary | ICD-10-CM

## 2017-09-23 ENCOUNTER — Ambulatory Visit: Payer: Self-pay | Admitting: Internal Medicine

## 2017-11-09 ENCOUNTER — Ambulatory Visit: Payer: Self-pay

## 2017-11-15 ENCOUNTER — Other Ambulatory Visit: Payer: Self-pay | Admitting: Internal Medicine

## 2017-11-15 DIAGNOSIS — B20 Human immunodeficiency virus [HIV] disease: Secondary | ICD-10-CM

## 2017-11-16 ENCOUNTER — Other Ambulatory Visit: Payer: Self-pay | Admitting: Behavioral Health

## 2017-11-16 DIAGNOSIS — B20 Human immunodeficiency virus [HIV] disease: Secondary | ICD-10-CM

## 2017-11-16 MED ORDER — ELVITEG-COBIC-EMTRICIT-TENOFAF 150-150-200-10 MG PO TABS: 1.0000 | ORAL_TABLET | Freq: Every day | ORAL | 3 refills | Status: DC

## 2017-11-17 ENCOUNTER — Encounter: Payer: Self-pay | Admitting: Internal Medicine

## 2017-11-26 ENCOUNTER — Other Ambulatory Visit: Payer: Self-pay | Admitting: *Deleted

## 2017-11-26 DIAGNOSIS — B20 Human immunodeficiency virus [HIV] disease: Secondary | ICD-10-CM

## 2017-11-30 ENCOUNTER — Other Ambulatory Visit: Payer: Self-pay

## 2017-11-30 DIAGNOSIS — B20 Human immunodeficiency virus [HIV] disease: Secondary | ICD-10-CM

## 2017-12-01 LAB — COMPLETE METABOLIC PANEL WITH GFR
AG RATIO: 1.8 (calc) (ref 1.0–2.5)
ALT: 21 U/L (ref 9–46)
AST: 22 U/L (ref 10–40)
Albumin: 4.7 g/dL (ref 3.6–5.1)
Alkaline phosphatase (APISO): 45 U/L (ref 40–115)
BILIRUBIN TOTAL: 0.5 mg/dL (ref 0.2–1.2)
BUN: 19 mg/dL (ref 7–25)
CHLORIDE: 105 mmol/L (ref 98–110)
CO2: 27 mmol/L (ref 20–32)
Calcium: 9.7 mg/dL (ref 8.6–10.3)
Creat: 1.04 mg/dL (ref 0.60–1.35)
GFR, Est African American: 114 mL/min/{1.73_m2} (ref >=60)
GFR, Est Non African American: 99 mL/min/{1.73_m2} (ref >=60)
GLUCOSE: 94 mg/dL (ref 65–99)
Globulin: 2.6 g/dL (calc) (ref 1.9–3.7)
POTASSIUM: 4 mmol/L (ref 3.5–5.3)
Sodium: 140 mmol/L (ref 135–146)
Total Protein: 7.3 g/dL (ref 6.1–8.1)

## 2017-12-01 LAB — CBC WITH DIFFERENTIAL/PLATELET
BASOS PCT: 0.6 %
Basophils Absolute: 40 cells/uL (ref 0–200)
EOS PCT: 2.9 %
Eosinophils Absolute: 194 cells/uL (ref 15–500)
HCT: 44.7 % (ref 38.5–50.0)
HEMOGLOBIN: 15.4 g/dL (ref 13.2–17.1)
LYMPHS ABS: 2191 {cells}/uL (ref 850–3900)
MCH: 30.6 pg (ref 27.0–33.0)
MCHC: 34.5 g/dL (ref 32.0–36.0)
MCV: 88.7 fL (ref 80.0–100.0)
MONOS PCT: 7.4 %
MPV: 10.6 fL (ref 7.5–12.5)
NEUTROS ABS: 3779 {cells}/uL (ref 1500–7800)
Neutrophils Relative %: 56.4 %
Platelets: 281 10*3/uL (ref 140–400)
RBC: 5.04 10*6/uL (ref 4.20–5.80)
RDW: 12.7 % (ref 11.0–15.0)
Total Lymphocyte: 32.7 %
WBC mixed population: 496 cells/uL (ref 200–950)
WBC: 6.7 10*3/uL (ref 3.8–10.8)

## 2017-12-01 LAB — T-HELPER CELL (CD4) - (RCID CLINIC ONLY)
CD4 % Helper T Cell: 29 % — ABNORMAL LOW (ref 33–55)
CD4 T CELL ABS: 650 /uL (ref 400–2700)

## 2017-12-01 LAB — FLUORESCENT TREPONEMAL AB(FTA)-IGG-BLD: Fluorescent Treponemal ABS: REACTIVE — AB

## 2017-12-01 LAB — URINE CYTOLOGY ANCILLARY ONLY
CHLAMYDIA, DNA PROBE: NEGATIVE
NEISSERIA GONORRHEA: NEGATIVE

## 2017-12-01 LAB — RPR TITER: RPR Titer: 1:1 {titer} — ABNORMAL HIGH

## 2017-12-01 LAB — RPR: RPR Ser Ql: REACTIVE — AB

## 2017-12-02 LAB — HIV-1 RNA QUANT-NO REFLEX-BLD
HIV 1 RNA QUANT: DETECTED {copies}/mL — AB
HIV-1 RNA QUANT, LOG: DETECTED {Log_copies}/mL — AB

## 2017-12-15 ENCOUNTER — Ambulatory Visit (INDEPENDENT_AMBULATORY_CARE_PROVIDER_SITE_OTHER): Payer: Self-pay | Admitting: Internal Medicine

## 2017-12-15 ENCOUNTER — Encounter: Payer: Self-pay | Admitting: Internal Medicine

## 2017-12-15 VITALS — BP 130/89 | HR 98 | Temp 98.2°F | Resp 18 | Ht 67.0 in | Wt 144.0 lb

## 2017-12-15 DIAGNOSIS — B20 Human immunodeficiency virus [HIV] disease: Secondary | ICD-10-CM

## 2017-12-15 DIAGNOSIS — Z113 Encounter for screening for infections with a predominantly sexual mode of transmission: Secondary | ICD-10-CM

## 2017-12-15 DIAGNOSIS — Z5181 Encounter for therapeutic drug level monitoring: Secondary | ICD-10-CM

## 2017-12-15 DIAGNOSIS — I10 Essential (primary) hypertension: Secondary | ICD-10-CM

## 2017-12-15 NOTE — Assessment & Plan Note (Signed)
Screened negative 

## 2017-12-15 NOTE — Progress Notes (Signed)
  Subjective:    Patient ID: Patrick Dunlap, male    DOB: 02/02/1991, 27 y.o.   MRN: [id number]  HPI He comes in for follow up of HIV.    He continues on Genvoya with no missed doses. CD4 count of 650 last visit and viral load <20. No weight loss, no diarrhea. Occasional sexual activity and uses condoms.  No penile discharge, no warts.  No associated n/v/d.  No questions today.    Review of Systems  Gastrointestinal: Negative for diarrhea and nausea.  Skin: Negative for rash.  Neurological: Negative for dizziness, light-headedness and headaches.       Objective:   Physical Exam  Constitutional: He appears well-developed and well-nourished. No distress.  HENT:  Mouth/Throat: No oropharyngeal exudate.  Eyes: Right eye exhibits no discharge. Left eye exhibits no discharge. No scleral icterus.  Cardiovascular: Normal rate, regular rhythm and normal heart sounds.  No murmur heard. Pulmonary/Chest: Effort normal and breath sounds normal. No respiratory distress. He has no wheezes.  Lymphadenopathy:    He has no cervical adenopathy.  Skin: No rash noted.     SH: no drug use      Assessment & Plan:

## 2017-12-15 NOTE — Assessment & Plan Note (Signed)
Stable bp now off of mediction.  Will monitor

## 2017-12-15 NOTE — Assessment & Plan Note (Signed)
Creat, LFTs wnl.  

## 2018-03-12 ENCOUNTER — Other Ambulatory Visit: Payer: Self-pay | Admitting: Internal Medicine

## 2018-03-12 DIAGNOSIS — B20 Human immunodeficiency virus [HIV] disease: Secondary | ICD-10-CM

## 2018-03-30 ENCOUNTER — Ambulatory Visit: Payer: Self-pay

## 2018-04-06 ENCOUNTER — Ambulatory Visit: Payer: Self-pay

## 2018-04-06 ENCOUNTER — Encounter: Payer: Self-pay | Admitting: Family

## 2018-04-06 ENCOUNTER — Telehealth: Payer: Self-pay

## 2018-04-06 ENCOUNTER — Ambulatory Visit (INDEPENDENT_AMBULATORY_CARE_PROVIDER_SITE_OTHER): Payer: Self-pay | Admitting: Family

## 2018-04-06 VITALS — BP 147/113 | HR 92 | Temp 98.3°F | Wt 137.0 lb

## 2018-04-06 DIAGNOSIS — R21 Rash and other nonspecific skin eruption: Secondary | ICD-10-CM

## 2018-04-06 MED ORDER — TRIAMCINOLONE ACETONIDE 0.1 % EX CREA: 1.0000 "application " | TOPICAL_CREAM | Freq: Two times a day (BID) | CUTANEOUS | 0 refills | Status: DC

## 2018-04-06 MED ORDER — PENICILLIN G BENZATHINE 1200000 UNIT/2ML IM SUSP
1.2000 10*6.[IU] | Freq: Once | INTRAMUSCULAR | Status: AC
  Administered 2018-04-06: 1.2 10*6.[IU] via INTRAMUSCULAR

## 2018-04-06 NOTE — Telephone Encounter (Signed)
PT walked into clinic with some questions regarding some "bumps" on pt's chest and upper deltoid. PT stated the breakout had began about a month ago on his arms and slowly started to show up on pt's chest. Pt stated the breakout does not itch or bother him. Only issues he has is his right arm and left side of abdomen bother him. PT denies any changes in cleaning soaps, fragrances, laundry detergent and medicine. Pt has tried taking cortisone cream, acne wash, and Eucerin soap however, this has not helped with the breakout. Pt is scheduled to see Marcos EkeGreg Calone, NP per Babette Relicammy, RN to have his breakout checked. PT will come back to clinic for this appt will notify Dr. Luciana Axeomer about pt's concerns today. Patrick CourierJose L Bemnet Dunlap, [place]MA

## 2018-04-06 NOTE — Patient Instructions (Addendum)
Nice to meet you.  We will check your blood work today.   Please use the steroid cream twice daily for 3-5 days and stop if no improvement.  A referral to dermatology has been sent.

## 2018-04-06 NOTE — Progress Notes (Signed)
Subjective:    Patient ID: Patrick Dunlap, male    DOB: 02-21-1991, 27 y.o.   MRN: [id number]  Chief Complaint  Patient presents with  . Rash     HPI:  Patrick Dunlap is a 27 y.o. male who presents today for an acute office visit.   This is a new problem. Associated symptom of a rash located on his chest and back has been going on for about a month with new spread to his arms in the last couple of days. Modifying factors include Eucerin soap and Cortisone cream which did not help at help. Rash around the arms is itchy. Unsure of any changes in size. No changes to body or skin care products or laundry detergent. No fevers, chills or sweats.   Allergies  Allergen Reactions  . Penicillins Rash      Outpatient Medications Prior to Visit  Medication Sig Dispense Refill  . elvitegravir-cobicistat-emtricitabine-tenofovir (GENVOYA) 150-150-200-10 MG TABS tablet Take 1 tablet by mouth daily with breakfast. 30 tablet 3  . GENVOYA 150-150-200-10 MG TABS tablet TAKE 1 TABLET BY MOUTH DAILY WITH BREAKFAST 30 tablet 5   No facility-administered medications prior to visit.      Past Medical History:  Diagnosis Date  . HIV infection (HCC)      History reviewed. No pertinent surgical history.   Review of Systems  Constitutional: Negative for chills and fever.  Respiratory: Negative for chest tightness, shortness of breath and wheezing.   Cardiovascular: Negative for chest pain, palpitations and leg swelling.  Skin: Positive for rash.      Objective:    BP (!) 147/113   Pulse 92   Temp 98.3 F (36.8 C) (Oral)   Wt 137 lb (62.1 kg)   BMI 21.46 kg/m  Nursing note and vital signs reviewed.  Physical Exam  Constitutional: He is oriented to person, place, and time. He appears well-developed and well-nourished. No distress.  Cardiovascular: Normal rate, regular rhythm, normal heart sounds and intact distal pulses.  Pulmonary/Chest: Effort normal and breath sounds normal.    Neurological: He is alert and oriented to person, place, and time.  Skin: Skin is warm and dry.  Diffuse maculpapular type rash located on the back and chest. No discharge or order. Lesions appear annular in shape with red base and clearly defined borders. There is no pattern to these lesions. There is no drainage.   Psychiatric: He has a normal mood and affect. His behavior is normal. Judgment and thought content normal.       Assessment & Plan:   Problem List Items Addressed This Visit      Musculoskeletal and Integument   Rash - Primary    New onset rash going on for about 1-2 months of unclear origin. I do have concerns that based on a seroconversion titer of his RPR in March that this could be secondary syphilis. I will recheck his RPR today. There is no rash currently present on his hands. Will try triamcinolone cream to see if that improves his symptoms as it does not appear to be fungal. A referral to dermatology has been placed. He has agreed to receive one dose of Bicillin. He does have an allergy to Pennicillin listed from when he was a child, but has tolerated Bicillin in the past with no adverse reactions. If no improvement may need biopsy to identify. Follow up if symptoms worsen or do not improve.       Relevant Medications   triamcinolone cream (  KENALOG) 0.1 %   penicillin g benzathine (BICILLIN LA) 1200000 UNIT/2ML injection 1.2 Million Units (Completed)   penicillin g benzathine (BICILLIN LA) 1200000 UNIT/2ML injection 1.2 Million Units (Completed)   Other Relevant Orders   RPR   Ambulatory referral to Dermatology       I have discontinued Keyshon Tamblyn's GENVOYA. I am also having him start on triamcinolone cream. Additionally, I am having him maintain his elvitegravir-cobicistat-emtricitabine-tenofovir. We administered penicillin g benzathine and penicillin g benzathine.   Meds ordered this encounter  Medications  . triamcinolone cream (KENALOG) 0.1 %    Sig:  Apply 1 application topically 2 (two) times daily.    Dispense:  30 g    Refill:  0    Order Specific Question:   Supervising Provider    Answer:   Judyann MunsonSNIDER, CYNTHIA [[id number]]  . penicillin g benzathine (BICILLIN LA) 1200000 UNIT/2ML injection 1.2 Million Units  . penicillin g benzathine (BICILLIN LA) 1200000 UNIT/2ML injection 1.2 Million Units     Follow-up: Return if symptoms worsen or fail to improve.   Marcos EkeGreg Vicie Cech, MSN, [place] LLCFNP-C [facility]

## 2018-04-06 NOTE — Assessment & Plan Note (Signed)
New onset rash going on for about 1-2 months of unclear origin. I do have concerns that based on a seroconversion titer of his RPR in March that this could be secondary syphilis. I will recheck his RPR today. There is no rash currently present on his hands. Will try triamcinolone cream to see if that improves his symptoms as it does not appear to be fungal. A referral to dermatology has been placed. He has agreed to receive one dose of Bicillin. He does have an allergy to Pennicillin listed from when he was a child, but has tolerated Bicillin in the past with no adverse reactions. If no improvement may need biopsy to identify. Follow up if symptoms worsen or do not improve.

## 2018-04-07 LAB — RPR: RPR: NONREACTIVE

## 2018-04-08 ENCOUNTER — Encounter (INDEPENDENT_AMBULATORY_CARE_PROVIDER_SITE_OTHER): Payer: Self-pay

## 2018-04-13 ENCOUNTER — Encounter: Payer: Self-pay | Admitting: Internal Medicine

## 2018-06-03 ENCOUNTER — Other Ambulatory Visit: Payer: Self-pay

## 2018-06-17 ENCOUNTER — Encounter: Payer: Self-pay | Admitting: Internal Medicine

## 2018-08-12 ENCOUNTER — Other Ambulatory Visit: Payer: Self-pay

## 2018-08-12 DIAGNOSIS — B20 Human immunodeficiency virus [HIV] disease: Secondary | ICD-10-CM

## 2018-08-13 LAB — T-HELPER CELL (CD4) - (RCID CLINIC ONLY)
CD4 T CELL ABS: 370 /uL — AB (ref 400–2700)
CD4 T CELL HELPER: 28 % — AB (ref 33–55)

## 2018-08-17 LAB — HIV-1 RNA QUANT-NO REFLEX-BLD

## 2018-08-24 LAB — HIV-1 RNA QUANT-NO REFLEX-BLD
HIV 1 RNA Quant: <20 copies/mL — AB
HIV-1 RNA Quant, Log: <1.3 Log copies/mL — AB

## 2018-08-27 ENCOUNTER — Encounter: Payer: Self-pay | Admitting: Internal Medicine

## 2018-08-30 ENCOUNTER — Encounter: Payer: Self-pay | Admitting: Internal Medicine

## 2018-08-30 ENCOUNTER — Ambulatory Visit (INDEPENDENT_AMBULATORY_CARE_PROVIDER_SITE_OTHER): Payer: Self-pay | Admitting: Internal Medicine

## 2018-08-30 VITALS — BP 158/113 | HR 98 | Temp 97.8°F | Wt 143.0 lb

## 2018-08-30 DIAGNOSIS — Z23 Encounter for immunization: Secondary | ICD-10-CM

## 2018-08-30 DIAGNOSIS — B20 Human immunodeficiency virus [HIV] disease: Secondary | ICD-10-CM

## 2018-08-30 DIAGNOSIS — Z113 Encounter for screening for infections with a predominantly sexual mode of transmission: Secondary | ICD-10-CM

## 2018-08-30 NOTE — Progress Notes (Signed)
  Subjective:    Patient ID: Patrick Dunlap, male    DOB: 07-Apr-1991, 27 y.o.   MRN: [id number]  HPI He comes in for follow up of HIV.    He continues on Genvoya with no missed doses. CD4 count of 370 and viral load <20. No weight loss, no diarrhea. Occasional sexual activity and uses condoms.  No penile discharge, no warts.  No associated n/v/d.  No questions today.    Review of Systems  Gastrointestinal: Negative for diarrhea and nausea.  Skin: Negative for rash.  Neurological: Negative for dizziness, light-headedness and headaches.       Objective:   Physical Exam  Constitutional: He appears well-developed and well-nourished. No distress.  HENT:  Mouth/Throat: No oropharyngeal exudate.  Eyes: Right eye exhibits no discharge. Left eye exhibits no discharge. No scleral icterus.  Cardiovascular: Normal rate, regular rhythm and normal heart sounds.  No murmur heard. Pulmonary/Chest: Effort normal and breath sounds normal. No respiratory distress. He has no wheezes.  Lymphadenopathy:    He has no cervical adenopathy.  Skin: No rash noted.   SH: no drug use      Assessment & Plan:

## 2018-08-30 NOTE — Assessment & Plan Note (Signed)
Prevnar and flu shot today.  

## 2018-08-30 NOTE — Assessment & Plan Note (Signed)
Will screen with swabs today

## 2018-08-30 NOTE — Assessment & Plan Note (Signed)
Doing well, no issues.  rtc 6 months.  

## 2018-08-31 LAB — CYTOLOGY, (ORAL, ANAL, URETHRAL) ANCILLARY ONLY
CHLAMYDIA, DNA PROBE: NEGATIVE
Chlamydia: NEGATIVE
Neisseria Gonorrhea: NEGATIVE
Neisseria Gonorrhea: NEGATIVE

## 2018-09-10 ENCOUNTER — Other Ambulatory Visit: Payer: Self-pay | Admitting: Internal Medicine

## 2018-09-10 DIAGNOSIS — B20 Human immunodeficiency virus [HIV] disease: Secondary | ICD-10-CM

## 2018-09-23 ENCOUNTER — Ambulatory Visit: Payer: Self-pay

## 2018-10-29 ENCOUNTER — Ambulatory Visit: Payer: Self-pay

## 2018-11-03 ENCOUNTER — Ambulatory Visit: Payer: Self-pay

## 2018-11-18 ENCOUNTER — Encounter: Payer: Self-pay | Admitting: Internal Medicine

## 2019-03-01 ENCOUNTER — Other Ambulatory Visit: Payer: Self-pay

## 2019-03-08 ENCOUNTER — Other Ambulatory Visit: Payer: Self-pay

## 2019-03-08 DIAGNOSIS — B20 Human immunodeficiency virus [HIV] disease: Secondary | ICD-10-CM

## 2019-03-08 DIAGNOSIS — Z113 Encounter for screening for infections with a predominantly sexual mode of transmission: Secondary | ICD-10-CM

## 2019-03-09 LAB — T-HELPER CELL (CD4) - (RCID CLINIC ONLY)
CD4 % Helper T Cell: 31 % — ABNORMAL LOW (ref 33–65)
CD4 T Cell Abs: 662 /uL (ref 400–1790)

## 2019-03-09 LAB — URINE CYTOLOGY ANCILLARY ONLY
Chlamydia: NEGATIVE
Neisseria Gonorrhea: NEGATIVE

## 2019-03-12 LAB — COMPLETE METABOLIC PANEL WITH GFR
AG Ratio: 2.1 (calc) (ref 1.0–2.5)
ALT: 18 U/L (ref 9–46)
AST: 23 U/L (ref 10–40)
Albumin: 4.6 g/dL (ref 3.6–5.1)
Alkaline phosphatase (APISO): 47 U/L (ref 36–130)
BUN: 19 mg/dL (ref 7–25)
CO2: 27 mmol/L (ref 20–32)
Calcium: 9.9 mg/dL (ref 8.6–10.3)
Chloride: 105 mmol/L (ref 98–110)
Creat: 1.04 mg/dL (ref 0.60–1.35)
GFR, Est African American: 113 mL/min/{1.73_m2} (ref >=60)
GFR, Est Non African American: 97 mL/min/{1.73_m2} (ref >=60)
Globulin: 2.2 g/dL (calc) (ref 1.9–3.7)
Glucose, Bld: 109 mg/dL — ABNORMAL HIGH (ref 65–99)
Potassium: 3.8 mmol/L (ref 3.5–5.3)
Sodium: 140 mmol/L (ref 135–146)
Total Bilirubin: 0.4 mg/dL (ref 0.2–1.2)
Total Protein: 6.8 g/dL (ref 6.1–8.1)

## 2019-03-12 LAB — CBC WITH DIFFERENTIAL/PLATELET
Absolute Monocytes: 497 cells/uL (ref 200–950)
Basophils Absolute: 49 cells/uL (ref 0–200)
Basophils Relative: 0.7 %
Eosinophils Absolute: 259 cells/uL (ref 15–500)
Eosinophils Relative: 3.7 %
HCT: 43 % (ref 38.5–50.0)
Hemoglobin: 14.7 g/dL (ref 13.2–17.1)
Lymphs Abs: 2247 cells/uL (ref 850–3900)
MCH: 31 pg (ref 27.0–33.0)
MCHC: 34.2 g/dL (ref 32.0–36.0)
MCV: 90.7 fL (ref 80.0–100.0)
MPV: 11 fL (ref 7.5–12.5)
Monocytes Relative: 7.1 %
Neutro Abs: 3948 cells/uL (ref 1500–7800)
Neutrophils Relative %: 56.4 %
Platelets: 257 10*3/uL (ref 140–400)
RBC: 4.74 10*6/uL (ref 4.20–5.80)
RDW: 12.3 % (ref 11.0–15.0)
Total Lymphocyte: 32.1 %
WBC: 7 10*3/uL (ref 3.8–10.8)

## 2019-03-12 LAB — HIV-1 RNA QUANT-NO REFLEX-BLD
HIV 1 RNA Quant: <20 copies/mL — AB
HIV-1 RNA Quant, Log: <1.3 Log copies/mL — AB

## 2019-03-12 LAB — RPR: RPR Ser Ql: NONREACTIVE

## 2019-03-16 ENCOUNTER — Telehealth: Payer: Self-pay | Admitting: *Deleted

## 2019-03-16 ENCOUNTER — Encounter: Payer: Self-pay | Admitting: Internal Medicine

## 2019-03-16 NOTE — Telephone Encounter (Signed)
RN left message asking patient to reschedule today's missed visit. He will need to renew his ADAP in July as well. Landis Gandy, RN

## 2019-03-22 ENCOUNTER — Other Ambulatory Visit: Payer: Self-pay | Admitting: *Deleted

## 2019-03-22 DIAGNOSIS — R21 Rash and other nonspecific skin eruption: Secondary | ICD-10-CM

## 2019-03-22 MED ORDER — TRIAMCINOLONE ACETONIDE 0.1 % EX CREA: 1.0000 "application " | TOPICAL_CREAM | Freq: Two times a day (BID) | CUTANEOUS | 0 refills | Status: DC

## 2019-03-23 ENCOUNTER — Other Ambulatory Visit: Payer: Self-pay

## 2019-04-03 ENCOUNTER — Other Ambulatory Visit: Payer: Self-pay | Admitting: Internal Medicine

## 2019-04-03 DIAGNOSIS — B20 Human immunodeficiency virus [HIV] disease: Secondary | ICD-10-CM

## 2019-04-06 ENCOUNTER — Ambulatory Visit (INDEPENDENT_AMBULATORY_CARE_PROVIDER_SITE_OTHER): Payer: Self-pay | Admitting: Internal Medicine

## 2019-04-06 ENCOUNTER — Encounter: Payer: Self-pay | Admitting: Internal Medicine

## 2019-04-06 ENCOUNTER — Other Ambulatory Visit: Payer: Self-pay

## 2019-04-06 ENCOUNTER — Ambulatory Visit: Payer: Self-pay

## 2019-04-06 VITALS — BP 157/93 | HR 79 | Temp 98.0°F | Wt 138.0 lb

## 2019-04-06 DIAGNOSIS — Z23 Encounter for immunization: Secondary | ICD-10-CM

## 2019-04-06 DIAGNOSIS — Z113 Encounter for screening for infections with a predominantly sexual mode of transmission: Secondary | ICD-10-CM

## 2019-04-06 DIAGNOSIS — Z5181 Encounter for therapeutic drug level monitoring: Secondary | ICD-10-CM

## 2019-04-06 DIAGNOSIS — B20 Human immunodeficiency virus [HIV] disease: Secondary | ICD-10-CM

## 2019-04-06 DIAGNOSIS — L309 Dermatitis, unspecified: Secondary | ICD-10-CM | POA: Insufficient documentation

## 2019-04-06 DIAGNOSIS — R21 Rash and other nonspecific skin eruption: Secondary | ICD-10-CM

## 2019-04-06 DIAGNOSIS — Z79899 Other long term (current) drug therapy: Secondary | ICD-10-CM

## 2019-04-06 MED ORDER — TRIAMCINOLONE ACETONIDE 0.1 % EX CREA: 1.0000 "application " | TOPICAL_CREAM | Freq: Two times a day (BID) | CUTANEOUS | 3 refills | Status: DC

## 2019-04-06 NOTE — Assessment & Plan Note (Signed)
Normal creat, LFTs 

## 2019-04-06 NOTE — Assessment & Plan Note (Signed)
Doing well, no issues.  rtc 6 months.  

## 2019-04-06 NOTE — Assessment & Plan Note (Signed)
Will get Meveo #2 today

## 2019-04-06 NOTE — Assessment & Plan Note (Signed)
Controlled with triamcinolone.  Will refill

## 2019-04-06 NOTE — Progress Notes (Signed)
  Subjective:    Patient ID: Patrick Dunlap, male    DOB: 1991/08/20, 28 y.o.   MRN: [id number]  HPI He comes in for follow up of HIV.    He continues on Genvoya with no missed doses. CD4 count of 662 and viral load <20. No weight loss, no diarrhea. Occasional sexual activity and uses condoms.  No penile discharge, no warts.  No associated n/v/d.  Asking for a larger supply of the steroid cream.     Review of Systems  Gastrointestinal: Negative for diarrhea and nausea.  Skin: Negative for rash.  Neurological: Negative for dizziness, light-headedness and headaches.       Objective:   Physical Exam  Constitutional: He appears well-developed and well-nourished. No distress.  Eyes: No scleral icterus.  Musculoskeletal:        General: No edema.  Neurological: He is alert.  Skin: No rash noted.   SH: no drug use      Assessment & Plan:

## 2019-04-06 NOTE — Addendum Note (Signed)
Addended by: Aundria Rud on: 04/06/2019 12:08 PM   Modules accepted: Orders

## 2019-04-29 ENCOUNTER — Other Ambulatory Visit: Payer: Self-pay | Admitting: Internal Medicine

## 2019-04-29 DIAGNOSIS — B20 Human immunodeficiency virus [HIV] disease: Secondary | ICD-10-CM

## 2019-05-17 ENCOUNTER — Other Ambulatory Visit: Payer: Self-pay

## 2019-05-17 ENCOUNTER — Emergency Department (HOSPITAL_COMMUNITY)
Admission: EM | Admit: 2019-05-17 | Discharge: 2019-05-17 | Disposition: A | Discharge status: Home / Self Care | Payer: Self-pay | Attending: Emergency Medicine | Admitting: Emergency Medicine

## 2019-05-17 ENCOUNTER — Encounter (HOSPITAL_COMMUNITY): Payer: Self-pay | Admitting: Emergency Medicine

## 2019-05-17 DIAGNOSIS — Z5321 Procedure and treatment not carried out due to patient leaving prior to being seen by health care provider: Secondary | ICD-10-CM | POA: Insufficient documentation

## 2019-05-17 DIAGNOSIS — L299 Pruritus, unspecified: Secondary | ICD-10-CM | POA: Insufficient documentation

## 2019-05-17 DIAGNOSIS — T63461A Toxic effect of venom of wasps, accidental (unintentional), initial encounter: Secondary | ICD-10-CM | POA: Insufficient documentation

## 2019-05-17 NOTE — ED Triage Notes (Signed)
Patient reports yellow jacket bites all over body while mowing. Reports generalized itching. Denies throat swelling, trouble breathing, SOB. Speaking in full sentences without difficulty. Reports taking 50mg  Benadryl PO 30 minutes PTA.

## 2019-05-17 NOTE — ED Notes (Signed)
Patient called for in lobby X2.

## 2019-05-17 NOTE — ED Provider Notes (Cosign Needed)
Patient left today without being seen, I did not participate in the care of this patient.   Patrick Dunlap, Patrick Dunlap 05/17/19 1659

## 2019-07-27 ENCOUNTER — Other Ambulatory Visit: Payer: Self-pay

## 2019-07-27 DIAGNOSIS — Z20822 Contact with and (suspected) exposure to covid-19: Secondary | ICD-10-CM

## 2019-07-28 LAB — NOVEL CORONAVIRUS, NAA: SARS-CoV-2, NAA: NOT DETECTED

## 2019-11-02 ENCOUNTER — Encounter: Payer: Self-pay | Admitting: Internal Medicine

## 2019-11-24 ENCOUNTER — Other Ambulatory Visit: Payer: Self-pay | Admitting: *Deleted

## 2019-11-24 DIAGNOSIS — B20 Human immunodeficiency virus [HIV] disease: Secondary | ICD-10-CM

## 2019-11-24 MED ORDER — GENVOYA 150-150-200-10 MG PO TABS: 1.0000 | ORAL_TABLET | Freq: Every day | ORAL | 0 refills | Status: DC

## 2019-11-28 ENCOUNTER — Other Ambulatory Visit: Payer: Self-pay

## 2019-11-28 DIAGNOSIS — Z79899 Other long term (current) drug therapy: Secondary | ICD-10-CM

## 2019-11-28 DIAGNOSIS — Z113 Encounter for screening for infections with a predominantly sexual mode of transmission: Secondary | ICD-10-CM

## 2019-11-28 DIAGNOSIS — B20 Human immunodeficiency virus [HIV] disease: Secondary | ICD-10-CM

## 2019-11-28 NOTE — Addendum Note (Signed)
Addended by: Mariea Clonts D on: 11/28/2019 10:28 AM   Modules accepted: Orders

## 2019-11-29 LAB — T-HELPER CELL (CD4) - (RCID CLINIC ONLY)
CD4 % Helper T Cell: 31 % — ABNORMAL LOW (ref 33–65)
CD4 T Cell Abs: 583 /uL (ref 400–1790)

## 2019-11-30 LAB — URINE CYTOLOGY ANCILLARY ONLY
Chlamydia: NEGATIVE
Comment: NEGATIVE
Comment: NORMAL
Neisseria Gonorrhea: NEGATIVE

## 2019-12-01 LAB — CBC WITH DIFFERENTIAL/PLATELET
Absolute Monocytes: 405 cells/uL (ref 200–950)
Basophils Absolute: 29 cells/uL (ref 0–200)
Basophils Relative: 0.5 %
Eosinophils Absolute: 148 cells/uL (ref 15–500)
Eosinophils Relative: 2.6 %
HCT: 44.4 % (ref 38.5–50.0)
Hemoglobin: 15.3 g/dL (ref 13.2–17.1)
Lymphs Abs: 1955 cells/uL (ref 850–3900)
MCH: 31 pg (ref 27.0–33.0)
MCHC: 34.5 g/dL (ref 32.0–36.0)
MCV: 89.9 fL (ref 80.0–100.0)
MPV: 10.6 fL (ref 7.5–12.5)
Monocytes Relative: 7.1 %
Neutro Abs: 3164 cells/uL (ref 1500–7800)
Neutrophils Relative %: 55.5 %
Platelets: 252 10*3/uL (ref 140–400)
RBC: 4.94 10*6/uL (ref 4.20–5.80)
RDW: 12.8 % (ref 11.0–15.0)
Total Lymphocyte: 34.3 %
WBC: 5.7 10*3/uL (ref 3.8–10.8)

## 2019-12-01 LAB — COMPLETE METABOLIC PANEL WITH GFR
AG Ratio: 1.9 (calc) (ref 1.0–2.5)
ALT: 18 U/L (ref 9–46)
AST: 23 U/L (ref 10–40)
Albumin: 4.5 g/dL (ref 3.6–5.1)
Alkaline phosphatase (APISO): 43 U/L (ref 36–130)
BUN: 16 mg/dL (ref 7–25)
CO2: 26 mmol/L (ref 20–32)
Calcium: 9.6 mg/dL (ref 8.6–10.3)
Chloride: 105 mmol/L (ref 98–110)
Creat: 1.02 mg/dL (ref 0.60–1.35)
GFR, Est African American: 115 mL/min/{1.73_m2} (ref >=60)
GFR, Est Non African American: 100 mL/min/{1.73_m2} (ref >=60)
Globulin: 2.4 g/dL (calc) (ref 1.9–3.7)
Glucose, Bld: 105 mg/dL — ABNORMAL HIGH (ref 65–99)
Potassium: 4.3 mmol/L (ref 3.5–5.3)
Sodium: 139 mmol/L (ref 135–146)
Total Bilirubin: 0.6 mg/dL (ref 0.2–1.2)
Total Protein: 6.9 g/dL (ref 6.1–8.1)

## 2019-12-01 LAB — LIPID PANEL
Cholesterol: 178 mg/dL (ref <200)
HDL: 59 mg/dL (ref >=40)
LDL Cholesterol (Calc): 104 mg/dL (calc) — ABNORMAL HIGH
Non-HDL Cholesterol (Calc): 119 mg/dL (calc) (ref <130)
Total CHOL/HDL Ratio: 3 (calc) (ref <5.0)
Triglycerides: 68 mg/dL (ref <150)

## 2019-12-01 LAB — HIV-1 RNA QUANT-NO REFLEX-BLD
HIV 1 RNA Quant: <20 copies/mL — AB
HIV-1 RNA Quant, Log: <1.3 Log copies/mL — AB

## 2019-12-01 LAB — RPR: RPR Ser Ql: NONREACTIVE

## 2019-12-13 ENCOUNTER — Encounter: Payer: Self-pay | Admitting: Internal Medicine

## 2019-12-26 ENCOUNTER — Other Ambulatory Visit: Payer: Self-pay | Admitting: Internal Medicine

## 2019-12-26 DIAGNOSIS — B20 Human immunodeficiency virus [HIV] disease: Secondary | ICD-10-CM

## 2020-01-10 ENCOUNTER — Other Ambulatory Visit: Payer: Self-pay | Admitting: Internal Medicine

## 2020-01-10 DIAGNOSIS — B20 Human immunodeficiency virus [HIV] disease: Secondary | ICD-10-CM

## 2020-01-11 ENCOUNTER — Telehealth: Payer: Self-pay
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
## 2020-01-11 NOTE — Telephone Encounter (Signed)
Patient will need to see a provider to discuss medication refill. He is scheduled to see Dreshon Proffit Sours, NP on tomorrow to discuss refills.   Laurell Josephs, RN

## 2020-01-12 ENCOUNTER — Encounter: Payer: Self-pay | Admitting: Family

## 2020-01-12 ENCOUNTER — Ambulatory Visit (INDEPENDENT_AMBULATORY_CARE_PROVIDER_SITE_OTHER): Payer: Self-pay | Admitting: Family

## 2020-01-12 ENCOUNTER — Other Ambulatory Visit: Payer: Self-pay

## 2020-01-12 VITALS — BP 149/94 | HR 98 | Ht 66.0 in | Wt 151.6 lb

## 2020-01-12 DIAGNOSIS — Z113 Encounter for screening for infections with a predominantly sexual mode of transmission: Secondary | ICD-10-CM

## 2020-01-12 DIAGNOSIS — B20 Human immunodeficiency virus [HIV] disease: Secondary | ICD-10-CM

## 2020-01-12 MED ORDER — GENVOYA 150-150-200-10 MG PO TABS: 1.0000 | ORAL_TABLET | Freq: Every day | ORAL | 5 refills | Status: DC

## 2020-01-12 NOTE — Patient Instructions (Signed)
Nice to meet you.   We will refill your Genvoya today - continue to take it daily.  Schedule an appointment to renew your financial assistance.   Plan for follow up in 6 months or sooner if needed with lab work 1-2 weeks prior to your appointment.   Have a great day and stay safe!

## 2020-01-12 NOTE — Progress Notes (Signed)
Subjective:    Patient ID: Patrick Dunlap, male    DOB: 1991/02/16, 29 y.o.   MRN: [id number]  Chief Complaint  Patient presents with  . Follow-up    renewal of medication     HPI:  Patrick Dunlap is a 29 y.o. male with HIV disease who was last seen in the office on 04/06/2019 with good adherence and tolerance to his ART regimen of Genvoya.  Lab work at the time showed a viral load that was undetectable with CD4 count of 662.  Most recent blood work completed on 11/28/2019 with CD4 count of 583 and viral load remains undetectable.  Patrick Dunlap continues to take his Genvoya as prescribed with no adverse side effects or missed doses since his last office visit.  Overall feeling well today with no new concerns/complaints. Denies fevers, chills, night sweats, headaches, changes in vision, neck pain/stiffness, nausea, diarrhea, vomiting, lesions or rashes.  Patrick Dunlap has no problem obtaining his medication from the pharmacy and has financial assistance through [employer]/ADAP.  Denies feelings of being down, depressed, or hopeless recently.  No recreational or illicit drug use, tobacco use, and alcohol consumption is occasional.  Not currently sexually active and declines condoms.   Allergies  Allergen Reactions  . Penicillins Rash      Outpatient Medications Prior to Visit  Medication Sig Dispense Refill  . triamcinolone cream (KENALOG) 0.1 % Apply 1 application topically 2 (two) times daily. 453.6 g 3  . GENVOYA 150-150-200-10 MG TABS tablet TAKE 1 TABLET BY MOUTH DAILY WITH BREAKFAST 30 tablet 0   No facility-administered medications prior to visit.     Past Medical History:  Diagnosis Date  . HIV infection ([place])      History reviewed. No pertinent surgical history.   Review of Systems  Constitutional: Negative for appetite change, chills, fatigue, fever and unexpected weight change.  Eyes: Negative for visual disturbance.  Respiratory: Negative for cough, chest  tightness, shortness of breath and wheezing.   Cardiovascular: Negative for chest pain and leg swelling.  Gastrointestinal: Negative for abdominal pain, constipation, diarrhea, nausea and vomiting.  Genitourinary: Negative for dysuria, flank pain, frequency, genital sores, hematuria and urgency.  Skin: Negative for rash.  Allergic/Immunologic: Negative for immunocompromised state.  Neurological: Negative for dizziness and headaches.      Objective:    BP (!) 149/94 (BP Location: Left Arm)   Pulse 98   Ht 5\' 6"  (1.676 m)   Wt 151 lb 9.6 oz (68.8 kg)   SpO2 95%   BMI 24.47 kg/m  Nursing note and vital signs reviewed.  Physical Exam Constitutional:      General: He is not in acute distress.    Appearance: He is well-developed.  Eyes:     Conjunctiva/sclera: Conjunctivae normal.  Cardiovascular:     Rate and Rhythm: Normal rate and regular rhythm.     Heart sounds: Normal heart sounds. No murmur. No friction rub. No gallop.   Pulmonary:     Effort: Pulmonary effort is normal. No respiratory distress.     Breath sounds: Normal breath sounds. No wheezing or rales.  Chest:     Chest wall: No tenderness.  Abdominal:     General: Bowel sounds are normal.     Palpations: Abdomen is soft.     Tenderness: There is no abdominal tenderness.  Musculoskeletal:     Cervical back: Neck supple.  Lymphadenopathy:     Cervical: No cervical adenopathy.  Skin:  General: Skin is warm and dry.     Findings: No rash.  Neurological:     Mental Status: He is alert and oriented to person, place, and time.  Psychiatric:        Behavior: Behavior normal.        Thought Content: Thought content normal.        Judgment: Judgment normal.      Depression screen [facility] 2/9 08/30/2018 03/23/2017 10/01/2016 04/02/2016 09/27/2015  Decreased Interest 0 0 0 0 0  Down, Depressed, Hopeless 0 0 0 0 0  PHQ - 2 Score 0 0 0 0 0  Altered sleeping - - - - -  Tired, decreased energy - - - - -  Change in appetite  - - - - -  Feeling bad or failure about yourself  - - - - -  Trouble concentrating - - - - -  Moving slowly or fidgety/restless - - - - -  Suicidal thoughts - - - - -  PHQ-9 Score - - - - -       Assessment & Plan:    Patient Active Problem List   Diagnosis Date Noted  . Eczema 04/06/2019  . Need for vaccination for bacterial disease 03/23/2017  . Medication monitoring encounter 10/01/2016  . Screening examination for venereal disease 09/27/2015  . Encounter for long-term (current) use of medications 09/27/2015  . Syphilis contact, treated 04/23/2015  . High blood pressure 01/16/2014  . Tattoos 07/29/2013  . Human immunodeficiency virus (HIV) disease (HCC) 07/29/2013     Problem List Items Addressed This Visit      Other   Human immunodeficiency virus (HIV) disease (HCC)    Mr. Dimmick continues to have well-controlled HIV disease with good adherence and tolerance to his ART regimen of Genvoya.  No signs/symptoms of opportunistic infection or progressive HIV disease.  He has no problems obtaining his medication from the pharmacy.  We reviewed his lab work and discussed the plan of care.  Continue current dose of Genvoya.  Plan for follow-up in 6 months or sooner if needed with lab work 1 to 2 weeks prior to appointment.  Reminded to schedule appointment for financial assistance renewal in July.      Relevant Medications   elvitegravir-cobicistat-emtricitabine-tenofovir (GENVOYA) 150-150-200-10 MG TABS tablet       I have changed Patrick Dunlap's Genvoya. I am also having him maintain his triamcinolone cream.   Meds ordered this encounter  Medications  . elvitegravir-cobicistat-emtricitabine-tenofovir (GENVOYA) 150-150-200-10 MG TABS tablet    Sig: Take 1 tablet by mouth daily with breakfast.    Dispense:  30 tablet    Refill:  5    Order Specific Question:   Supervising Provider    Answer:   Judyann Munson [[id number]]     Follow-up: Return in about 6 months (around  07/13/2020), or if symptoms worsen or fail to improve.   Marcos Eke, MSN, FNP-C Nurse Practitioner [facility] for Infectious Disease [facility] Main number: [phone]

## 2020-01-13 NOTE — Assessment & Plan Note (Signed)
Mr. Kohan continues to have well-controlled HIV disease with good adherence and tolerance to his ART regimen of Genvoya.  No signs/symptoms of opportunistic infection or progressive HIV disease.  He has no problems obtaining his medication from the pharmacy.  We reviewed his lab work and discussed the plan of care.  Continue current dose of Genvoya.  Plan for follow-up in 6 months or sooner if needed with lab work 1 to 2 weeks prior to appointment.  Reminded to schedule appointment for financial assistance renewal in July.

## 2020-01-13 NOTE — Addendum Note (Signed)
Addended by: Jeanine Luz D on: 01/13/2020 12:56 PM   Modules accepted: Orders

## 2020-01-24 ENCOUNTER — Ambulatory Visit: Payer: Self-pay | Admitting: Internal Medicine

## 2020-06-06 ENCOUNTER — Other Ambulatory Visit: Payer: Self-pay

## 2020-06-11 ENCOUNTER — Other Ambulatory Visit: Payer: Self-pay

## 2020-06-25 ENCOUNTER — Ambulatory Visit (INDEPENDENT_AMBULATORY_CARE_PROVIDER_SITE_OTHER): Payer: Self-pay

## 2020-06-25 ENCOUNTER — Other Ambulatory Visit: Payer: Self-pay

## 2020-06-25 ENCOUNTER — Ambulatory Visit (INDEPENDENT_AMBULATORY_CARE_PROVIDER_SITE_OTHER): Payer: Self-pay | Admitting: Family

## 2020-06-25 ENCOUNTER — Encounter: Payer: Self-pay | Admitting: Family

## 2020-06-25 VITALS — BP 142/99 | HR 78 | Temp 98.2°F | Resp 16 | Ht 66.0 in | Wt 161.0 lb

## 2020-06-25 DIAGNOSIS — Z113 Encounter for screening for infections with a predominantly sexual mode of transmission: Secondary | ICD-10-CM

## 2020-06-25 DIAGNOSIS — Z0000 Encounter for general adult medical examination without abnormal findings: Secondary | ICD-10-CM | POA: Insufficient documentation

## 2020-06-25 DIAGNOSIS — B20 Human immunodeficiency virus [HIV] disease: Secondary | ICD-10-CM

## 2020-06-25 DIAGNOSIS — Z23 Encounter for immunization: Secondary | ICD-10-CM

## 2020-06-25 MED ORDER — GENVOYA 150-150-200-10 MG PO TABS: 1.0000 | ORAL_TABLET | Freq: Every day | ORAL | 5 refills | Status: DC

## 2020-06-25 NOTE — Progress Notes (Signed)
Subjective:    Patient ID: Patrick Dunlap, male    DOB: 19-Aug-1991, 29 y.o.   MRN: [id number]  Chief Complaint  Patient presents with  . Follow-up    wants Covid vaccine and gave condoms to patient. Also mentioned he needs labs.      HPI:  Patrick Dunlap is a 29 y.o. male with HIV disease was last seen in the office on 01/12/2020 with good adherence and tolerance to his ART regimen of Genvoya.  Viral load at the time was undetectable with CD4 count of 583.  Here today for routine follow-up.  Patrick Dunlap continues to take his Patrick Dunlap daily as prescribed with no adverse side effects or missed doses since his last office visit.  Overall feeling well today with no new concerns/complaints. Denies fevers, chills, night sweats, headaches, changes in vision, neck pain/stiffness, nausea, diarrhea, vomiting, lesions or rashes.  Patrick Dunlap financial assistance expired on 9/31/21 and has approximately 1 week of medication left.  He renewed his financial assistance today.  May need assistance with additional coverage during renewal processing.  Denies any feelings of being down, depressed, or hopeless.  No recreational or illicit drug use or tobacco use.  Alcohol use is occasional.  Due for routine dental care and will complete dental form today.  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   Allergies  Allergen Reactions  . Penicillins Rash      Outpatient Medications Prior to Visit  Medication Sig Dispense Refill  . elvitegravir-cobicistat-emtricitabine-tenofovir (GENVOYA) 150-150-200-10 MG TABS tablet Take 1 tablet by mouth daily with breakfast. 30 tablet 5  . triamcinolone cream (KENALOG) 0.1 % Apply 1 application topically 2 (two) times daily. 453.6 g 3   No facility-administered medications prior to visit.     Past Medical History:  Diagnosis Date  . HIV infection (HCC)      History reviewed. No pertinent surgical history.   Review of Systems  Constitutional: Negative for  appetite change, chills, fatigue, fever and unexpected weight change.  Eyes: Negative for visual disturbance.  Respiratory: Negative for cough, chest tightness, shortness of breath and wheezing.   Cardiovascular: Negative for chest pain and leg swelling.  Gastrointestinal: Negative for abdominal pain, constipation, diarrhea, nausea and vomiting.  Genitourinary: Negative for dysuria, flank pain, frequency, genital sores, hematuria and urgency.  Skin: Negative for rash.  Allergic/Immunologic: Negative for immunocompromised state.  Neurological: Negative for dizziness and headaches.      Objective:    BP (!) 142/99   Pulse 78   Temp 98.2 F (36.8 C) (Oral)   Resp 16   Ht 5\' 6"  (1.676 m)   Wt 161 lb (73 kg)   SpO2 98%   BMI 25.99 kg/m  Nursing note and vital signs reviewed.  Physical Exam Constitutional:      General: He is not in acute distress.    Appearance: He is well-developed.  Eyes:     Conjunctiva/sclera: Conjunctivae normal.  Cardiovascular:     Rate and Rhythm: Normal rate and regular rhythm.     Heart sounds: Normal heart sounds. No murmur heard.  No friction rub. No gallop.   Pulmonary:     Effort: Pulmonary effort is normal. No respiratory distress.     Breath sounds: Normal breath sounds. No wheezing or rales.  Chest:     Chest wall: No tenderness.  Abdominal:     General: Bowel sounds are normal.     Palpations: Abdomen is soft.     Tenderness: There is no  abdominal tenderness.  Musculoskeletal:     Cervical back: Neck supple.  Lymphadenopathy:     Cervical: No cervical adenopathy.  Skin:    General: Skin is warm and dry.     Findings: No rash.  Neurological:     Mental Status: He is alert and oriented to person, place, and time.  Psychiatric:        Behavior: Behavior normal.        Thought Content: Thought content normal.        Judgment: Judgment normal.      Depression screen [facility] 2/9 08/30/2018 03/23/2017 10/01/2016 04/02/2016 09/27/2015    Decreased Interest 0 0 0 0 0  Down, Depressed, Hopeless 0 0 0 0 0  PHQ - 2 Score 0 0 0 0 0  Altered sleeping - - - - -  Tired, decreased energy - - - - -  Change in appetite - - - - -  Feeling bad or failure about yourself  - - - - -  Trouble concentrating - - - - -  Moving slowly or fidgety/restless - - - - -  Suicidal thoughts - - - - -  PHQ-9 Score - - - - -       Assessment & Plan:    Patient Active Problem List   Diagnosis Date Noted  . Healthcare maintenance 06/25/2020  . Eczema 04/06/2019  . Need for vaccination for bacterial disease 03/23/2017  . Medication monitoring encounter 10/01/2016  . Screening examination for venereal disease 09/27/2015  . Encounter for long-term (current) use of medications 09/27/2015  . Syphilis contact, treated 04/23/2015  . High blood pressure 01/16/2014  . Tattoos 07/29/2013  . Human immunodeficiency virus (HIV) disease (HCC) 07/29/2013     Problem List Items Addressed This Visit      Other   Human immunodeficiency virus (HIV) disease (HCC) - Primary    Patrick Dunlap continues to have well-controlled HIV disease with good adherence and tolerance to his ART regimen of Genvoya.  No signs/symptoms of opportunistic infection or progressive HIV disease.  Reviewed previous lab work and discussed plan of care.  Check blood work today.  Continue current dose of Genvoya. Pharmacy staff able to obtain 30-day supply of medication while awaiting renewal of financial assistance. Plan for follow-up in 4 months or sooner if needed with lab work on the same day.      Relevant Medications   elvitegravir-cobicistat-emtricitabine-tenofovir (GENVOYA) 150-150-200-10 MG TABS tablet   Other Relevant Orders   COMPLETE METABOLIC PANEL WITH GFR   HIV-1 RNA quant-no reflex-bld   T-helper cell (CD4)- ([facility] clinic only)   Healthcare maintenance     Discussed importance of safe sexual practice to reduce risk of STI.  Condoms provided.  Pfizer Covid vaccination  provided today and will schedule second dose.  Referral placed to [facility] dental clinic for routine dental care.       Other Visit Diagnoses    Screening for STDs (sexually transmitted diseases)       Relevant Orders   RPR   Urine cytology ancillary only([place])       I have discontinued Patrick Dunlap's triamcinolone cream. I am also having him maintain his Genvoya.   Meds ordered this encounter  Medications  . elvitegravir-cobicistat-emtricitabine-tenofovir (GENVOYA) 150-150-200-10 MG TABS tablet    Sig: Take 1 tablet by mouth daily with breakfast.    Dispense:  30 tablet    Refill:  5    Order Specific Question:  Supervising Provider    Answer:   Judyann Munson [[id number]]     Follow-up: Return in about 4 months (around 10/26/2020), or if symptoms worsen or fail to improve.   Marcos Eke, MSN, FNP-C Nurse Practitioner [facility] for Infectious Disease [facility] Main number: [phone]

## 2020-06-25 NOTE — Patient Instructions (Addendum)
Nice to see you.  We will check your lab work today and get you on the dental list for routine dental care   Continue to take your Genvoya daily as prescribed.  A refill will be available at the pharmacy.  Plan for follow up in 4 months or sooner if needed with lab work on the same day.   Have a great day and stay safe!

## 2020-06-25 NOTE — Assessment & Plan Note (Signed)
   Discussed importance of safe sexual practice to reduce risk of STI.  Condoms provided.  Pfizer Covid vaccination provided today and will schedule second dose.  Referral placed to [facility] dental clinic for routine dental care.

## 2020-06-25 NOTE — Progress Notes (Signed)
   Covid-19 Vaccination Clinic  Name:  Patrick Dunlap    MRN: [id number] DOB: [birth year]  06/25/2020  Patrick Dunlap was observed post Covid-19 immunization for 15 minutes without incident. He was provided with Vaccine Information Sheet and instruction to access the V-Safe system.   Patrick Dunlap was instructed to call 911 with any severe reactions post vaccine: [name] Kitchen Difficulty breathing  . Swelling of face and throat  . A fast heartbeat  . A bad rash all over body  . Dizziness and weakness   Immunizations Administered    Name Date Dose VIS Date Route   Pfizer COVID-19 Vaccine 06/25/2020 11:16 AM 0.3 mL 11/16/2018 Intramuscular   Manufacturer: [employer], [employer]   Lot: [id number]   NDC: [address]-5    Finola Rosal T Pricilla Loveless

## 2020-06-25 NOTE — Assessment & Plan Note (Addendum)
Patrick Dunlap continues to have well-controlled HIV disease with good adherence and tolerance to his ART regimen of Genvoya.  No signs/symptoms of opportunistic infection or progressive HIV disease.  Reviewed previous lab work and discussed plan of care.  Check blood work today.  Continue current dose of Genvoya. Pharmacy staff able to obtain 30-day supply of medication while awaiting renewal of financial assistance. Plan for follow-up in 4 months or sooner if needed with lab work on the same day.

## 2020-06-26 LAB — URINE CYTOLOGY ANCILLARY ONLY
Chlamydia: NEGATIVE
Comment: NEGATIVE
Comment: NORMAL
Neisseria Gonorrhea: NEGATIVE

## 2020-06-26 LAB — T-HELPER CELL (CD4) - (RCID CLINIC ONLY)
CD4 % Helper T Cell: 34 % (ref 33–65)
CD4 T Cell Abs: 496 /uL (ref 400–1790)

## 2020-06-28 LAB — COMPLETE METABOLIC PANEL WITH GFR
AG Ratio: 1.7 (calc) (ref 1.0–2.5)
ALT: 32 U/L (ref 9–46)
AST: 33 U/L (ref 10–40)
Albumin: 4.5 g/dL (ref 3.6–5.1)
Alkaline phosphatase (APISO): 50 U/L (ref 36–130)
BUN: 14 mg/dL (ref 7–25)
CO2: 28 mmol/L (ref 20–32)
Calcium: 9.7 mg/dL (ref 8.6–10.3)
Chloride: 105 mmol/L (ref 98–110)
Creat: 0.91 mg/dL (ref 0.60–1.35)
GFR, Est African American: 132 mL/min/{1.73_m2} (ref >=60)
GFR, Est Non African American: 113 mL/min/{1.73_m2} (ref >=60)
Globulin: 2.6 g/dL (calc) (ref 1.9–3.7)
Glucose, Bld: 93 mg/dL (ref 65–99)
Potassium: 4.1 mmol/L (ref 3.5–5.3)
Sodium: 140 mmol/L (ref 135–146)
Total Bilirubin: 0.4 mg/dL (ref 0.2–1.2)
Total Protein: 7.1 g/dL (ref 6.1–8.1)

## 2020-06-28 LAB — HIV-1 RNA QUANT-NO REFLEX-BLD
HIV 1 RNA Quant: <20 Copies/mL — ABNORMAL HIGH
HIV-1 RNA Quant, Log: <1.3 Log cps/mL — ABNORMAL HIGH

## 2020-06-28 LAB — RPR: RPR Ser Ql: REACTIVE — AB

## 2020-06-28 LAB — FLUORESCENT TREPONEMAL AB(FTA)-IGG-BLD: Fluorescent Treponemal ABS: REACTIVE — AB

## 2020-06-28 LAB — RPR TITER: RPR Titer: 1:1 {titer} — ABNORMAL HIGH

## 2020-06-29 ENCOUNTER — Encounter: Payer: Self-pay | Admitting: Family

## 2020-07-03 ENCOUNTER — Other Ambulatory Visit: Payer: Self-pay | Admitting: Internal Medicine

## 2020-07-03 DIAGNOSIS — R21 Rash and other nonspecific skin eruption: Secondary | ICD-10-CM

## 2020-07-20 ENCOUNTER — Ambulatory Visit: Payer: Self-pay

## 2020-10-26 ENCOUNTER — Ambulatory Visit: Payer: Self-pay | Admitting: Family

## 2020-12-31 ENCOUNTER — Other Ambulatory Visit: Payer: Self-pay

## 2020-12-31 DIAGNOSIS — B20 Human immunodeficiency virus [HIV] disease: Secondary | ICD-10-CM

## 2020-12-31 MED ORDER — GENVOYA 150-150-200-10 MG PO TABS: 1.0000 | ORAL_TABLET | Freq: Every day | ORAL | 0 refills | Status: DC

## 2021-01-01 ENCOUNTER — Telehealth: Payer: Self-pay

## 2021-01-01 ENCOUNTER — Ambulatory Visit: Payer: Self-pay

## 2021-01-01 ENCOUNTER — Other Ambulatory Visit: Payer: Self-pay

## 2021-01-01 NOTE — Telephone Encounter (Signed)
[facility] Patient Advocate Encounter  Completed and sent [employer] Advancing Access application for Genvoya for this patient who is uninsured.    Patient is approved 01/01/21 through 06/25/21.  BIN      [id number] PCN    [id number] [id number]    [id number] ID        [id number]   Clearance Coots, CPhT Specialty Pharmacy Patient [facility] for Infectious Disease Phone: [phone] Fax:  [phone]

## 2021-01-03 ENCOUNTER — Encounter: Payer: Self-pay | Admitting: Family

## 2021-01-11 ENCOUNTER — Ambulatory Visit: Payer: Self-pay | Admitting: Family

## 2021-01-15 ENCOUNTER — Encounter (HOSPITAL_COMMUNITY): Payer: Self-pay | Admitting: Emergency Medicine

## 2021-01-15 ENCOUNTER — Emergency Department (HOSPITAL_COMMUNITY)
Admission: EM | Admit: 2021-01-15 | Discharge: 2021-01-15 | Disposition: A | Discharge status: Home / Self Care | Payer: Self-pay | Attending: Emergency Medicine | Admitting: Emergency Medicine

## 2021-01-15 ENCOUNTER — Emergency Department (HOSPITAL_COMMUNITY): Payer: Self-pay

## 2021-01-15 ENCOUNTER — Other Ambulatory Visit: Payer: Self-pay

## 2021-01-15 DIAGNOSIS — S51812A Laceration without foreign body of left forearm, initial encounter: Secondary | ICD-10-CM | POA: Insufficient documentation

## 2021-01-15 DIAGNOSIS — I10 Essential (primary) hypertension: Secondary | ICD-10-CM | POA: Insufficient documentation

## 2021-01-15 DIAGNOSIS — S41112A Laceration without foreign body of left upper arm, initial encounter: Secondary | ICD-10-CM

## 2021-01-15 DIAGNOSIS — Z23 Encounter for immunization: Secondary | ICD-10-CM | POA: Insufficient documentation

## 2021-01-15 DIAGNOSIS — Z20822 Contact with and (suspected) exposure to covid-19: Secondary | ICD-10-CM | POA: Insufficient documentation

## 2021-01-15 DIAGNOSIS — Z21 Asymptomatic human immunodeficiency virus [HIV] infection status: Secondary | ICD-10-CM | POA: Insufficient documentation

## 2021-01-15 DIAGNOSIS — W228XXA Striking against or struck by other objects, initial encounter: Secondary | ICD-10-CM | POA: Insufficient documentation

## 2021-01-15 DIAGNOSIS — S55012A Laceration of ulnar artery at forearm level, left arm, initial encounter: Secondary | ICD-10-CM

## 2021-01-15 LAB — I-STAT CHEM 8, ED
BUN: 21 mg/dL — ABNORMAL HIGH (ref 6–20)
Calcium, Ion: 1.08 mmol/L — ABNORMAL LOW (ref 1.15–1.40)
Chloride: 102 mmol/L (ref 98–111)
Creatinine, Ser: 1.3 mg/dL — ABNORMAL HIGH (ref 0.61–1.24)
Glucose, Bld: 133 mg/dL — ABNORMAL HIGH (ref 70–99)
HCT: 42 % (ref 39.0–52.0)
Hemoglobin: 14.3 g/dL (ref 13.0–17.0)
Potassium: 4 mmol/L (ref 3.5–5.1)
Sodium: 138 mmol/L (ref 135–145)
TCO2: 27 mmol/L (ref 22–32)

## 2021-01-15 LAB — CBC WITH DIFFERENTIAL/PLATELET
Abs Immature Granulocytes: 0.05 10*3/uL (ref 0.00–0.07)
Basophils Absolute: 0 10*3/uL (ref 0.0–0.1)
Basophils Relative: 0 %
Eosinophils Absolute: 0 10*3/uL (ref 0.0–0.5)
Eosinophils Relative: 0 %
HCT: 41.9 % (ref 39.0–52.0)
Hemoglobin: 13.9 g/dL (ref 13.0–17.0)
Immature Granulocytes: 0 %
Lymphocytes Relative: 7 %
Lymphs Abs: 1.1 10*3/uL (ref 0.7–4.0)
MCH: 31.4 pg (ref 26.0–34.0)
MCHC: 33.2 g/dL (ref 30.0–36.0)
MCV: 94.6 fL (ref 80.0–100.0)
Monocytes Absolute: 0.8 10*3/uL (ref 0.1–1.0)
Monocytes Relative: 5 %
Neutro Abs: 14.2 10*3/uL — ABNORMAL HIGH (ref 1.7–7.7)
Neutrophils Relative %: 88 %
Platelets: 258 10*3/uL (ref 150–400)
RBC: 4.43 MIL/uL (ref 4.22–5.81)
RDW: 12.5 % (ref 11.5–15.5)
WBC: 16.3 10*3/uL — ABNORMAL HIGH (ref 4.0–10.5)
nRBC: 0 % (ref 0.0–0.2)

## 2021-01-15 LAB — RESP PANEL BY RT-PCR (FLU A&B, COVID) ARPGX2
Influenza A by PCR: NEGATIVE
Influenza B by PCR: NEGATIVE
SARS Coronavirus 2 by RT PCR: NEGATIVE

## 2021-01-15 MED ORDER — TETANUS-DIPHTH-ACELL PERTUSSIS 5-2.5-18.5 LF-MCG/0.5 IM SUSY
0.5000 mL | PREFILLED_SYRINGE | Freq: Once | INTRAMUSCULAR | Status: AC
  Administered 2021-01-15: 0.5 mL via INTRAMUSCULAR
  Filled 2021-01-15: qty 0.5

## 2021-01-15 MED ORDER — LIDOCAINE-EPINEPHRINE (PF) 2 %-1:200000 IJ SOLN
10.0000 mL | Freq: Once | INTRAMUSCULAR | Status: AC
  Administered 2021-01-15: 10 mL via INTRADERMAL
  Filled 2021-01-15: qty 20

## 2021-01-15 MED ORDER — SODIUM CHLORIDE 0.9 % IV BOLUS
1000.0000 mL | Freq: Once | INTRAVENOUS | Status: AC
  Administered 2021-01-15: 1000 mL via INTRAVENOUS

## 2021-01-15 MED ORDER — IOHEXOL 350 MG/ML SOLN
100.0000 mL | Freq: Once | INTRAVENOUS | Status: AC | PRN
  Administered 2021-01-15: 100 mL via INTRAVENOUS

## 2021-01-15 MED ORDER — FENTANYL CITRATE (PF) 100 MCG/2ML IJ SOLN
50.0000 ug | Freq: Once | INTRAMUSCULAR | Status: AC
  Administered 2021-01-15: 50 ug via INTRAVENOUS
  Filled 2021-01-15: qty 2

## 2021-01-15 MED ORDER — OXYCODONE-ACETAMINOPHEN 5-325 MG PO TABS
1.0000 | ORAL_TABLET | Freq: Once | ORAL | Status: AC
  Administered 2021-01-15: 1 via ORAL
  Filled 2021-01-15: qty 1

## 2021-01-15 NOTE — ED Notes (Addendum)
Pt found to be bleeding from suture site. Applied pressure and guaze to the area and notified PA. Pt in NAD. Left arm swollen with bruising and tightness.

## 2021-01-15 NOTE — ED Notes (Signed)
Suture cart place into room

## 2021-01-15 NOTE — ED Notes (Signed)
Patient transported to CT 

## 2021-01-15 NOTE — ED Provider Notes (Signed)
MSE was initiated and I personally evaluated the patient and placed orders (if any) at  3:19 AM on January 15, 2021.  The patient appears stable so that the remainder of the MSE may be completed by another provider.  Patient placed in Quick Look pathway, seen and evaluated   Chief Complaint: laceration  HPI:   30 y.o. M who presents for evaluation of LUE laceration that occurred 15 minutes prior to ED arrival. Unknown last tetanus. No numbness/weaknes.   ROS: laceration   Physical Exam:   Gen: No distress  Neuro: Awake and Alert  Skin: Warm    Focused Exam: 1.5 cm laceration to proximal LUE. Palpable ulnar pulse. Radial pulse detected with doppler. Good distal cap refill. LUE is not dusky in appearance or cool to touch.    Initiation of care has begun. The patient has been counseled on the process, plan, and necessity for staying for the completion/evaluation, and the remainder of the medical screening examination    Rosana Hoes 01/15/21 0320    Shon Baton, MD 01/15/21 [phone]

## 2021-01-15 NOTE — ED Triage Notes (Signed)
Pt presents with laceration to left elbow. States he hit his arm on a porcelain pot. Unknown last tetanus.

## 2021-01-15 NOTE — Discharge Instructions (Addendum)
Keep the wound clean and dry for the first 24 hours. After that you may gently clean the wound with soap and water. Make sure to pat dry the wound before covering it with any dressing. You can use topical antibiotic ointment and bandage. Ice and elevate for pain relief.   You can take Tylenol or Ibuprofen as directed for pain. You can alternate Tylenol and Ibuprofen every 4 hours for additional pain relief.   Return to the Emergency Department, your primary care doctor, or the [facility] in 7-10 days for suture removal.   Monitor closely for any signs of infection. Return to the Emergency Department for any worsening redness/swelling of the area that begins to spread, drainage from the site, worsening pain, fever or any other worsening or concerning symptoms.   At this time there does not appear to be the presence of an emergent medical condition, however there is always the potential for conditions to change. Please read and follow the below instructions.  Please return to the Emergency Department immediately for any new or worsening symptoms. Please be sure to follow up with your Primary Care Provider within one week regarding your visit today; please call their office to schedule an appointment even if you are feeling better for a follow-up visit. Please follow-up with orthopedic specialist Dr. Carola Frost within 1 week for reassessment of your injury.  Go to the nearest Emergency Department immediately if: You have fever or chills You have very bad swelling around the wound. Your pain suddenly gets worse and is very bad. You notice painful lumps near the wound or anywhere on your body. You have a red streak going away from your wound. The wound is on your hand or foot, and: You cannot move a finger or toe. Your fingers or toes look pale or bluish. You have any new/concerning or worsening of symptoms.   Please read the additional information packets attached to your  discharge summary.  Do not take your medicine if  develop an itchy rash, swelling in your mouth or lips, or difficulty breathing; call 911 and seek immediate emergency medical attention if this occurs.  You may review your lab tests and imaging results in their entirety on your MyChart account.  Please discuss all results of fully with your primary care provider and other specialist at your follow-up visit.  Note: Portions of this text may have been transcribed using voice recognition software. Every effort was made to ensure accuracy; however, inadvertent computerized transcription errors may still be present.

## 2021-01-15 NOTE — Consult Note (Signed)
   Patient name: Patrick Dunlap MRN: [id number] DOB: [birth year] Sex: male  REASON FOR CONSULT:   Laceration left  HPI:   Patrick Dunlap is a pleasant 30 y.o. male who last night was reaching up to grab something that had a which was broken and cut his left forearm.  He said he had significant blood loss at the scene and therefore applied a tourniquet himself.  Initially he had no left radial pulse.  He had a regular ulnar there is a somewhat pulse.  A CT angiogram neck showed occlusion of the radial artery which had become diminutive in size.  I suspect this could possibly spinal spasm.  The emergency department sutured his wound.  No current facility-administered medications for this encounter.   Current Outpatient Medications  Medication Sig Dispense Refill  . elvitegravir-cobicistat-emtricitabine-tenofovir (GENVOYA) 150-150-200-10 MG TABS tablet Take 1 tablet by mouth daily with breakfast. 30 tablet 0    REVIEW OF SYSTEMS:  [X]  denotes positive finding, [ ]  denotes negative finding Vascular    Leg swelling    Cardiac    Chest pain or chest pressure:    Shortness of breath upon exertion:    Short of breath when lying flat:    Irregular heart rhythm:    Constitutional    Fever or chills:     PHYSICAL EXAM:   Vitals:   01/15/21 0743 01/15/21 0822 01/15/21 0915 01/15/21 1000  BP: (!) 121/103 (!) 146/101 121/82 129/73  Pulse: 87 89 72 71  Resp: 20 14  20   Temp:  99.4 F (37.4 C)    TempSrc:  Oral    SpO2: 98% 98% 98% 97%  Weight:      Height:        GENERAL: The patient is a well-nourished male, in no acute distress. The vital signs are documented above. CARDIOVASCULAR: There is a regular rate and rhythm. PULMONARY: There is good air exchange bilaterally without wheezing or rales. He has an outflow of his left radial and ulnar pulse. NEURO has no focal weakness, and no sensory deficit. He has moderate swelling in the left forearm.  DATA:   CT Angio  shows occlusion of the right radial art in the proximal forearm.   MEDICAL ISSUES:   LACERATION LEFT FOREARM: Based on the location of the laceration I do not think he has an injury to the radial artery.  I think he has artery as related to the tourniquet.  Currently has a palpable left radial pulse.  Therefore this could be retrograde flow from the ulnar artery for the finding on CT scan could be spasm.  Regardless there is no indication for surgical exploration.  He has moderate swelling in the forearm and have instructed him to elevate his arm above his marked and take it easy with the left arm for a few days.  If he were to develop paresthesias in the left arm we would need to let 01/17/21 know given the risk for compartment syndrome.     01/17/21 [facility] [phone]

## 2021-01-15 NOTE — ED Provider Notes (Signed)
[facility] EMERGENCY DEPARTMENT Provider Note   CSN: [id number] Arrival date & time: 01/15/21  0250     History Chief Complaint  Patient presents with  . Laceration    Patrick Dunlap is a 30 y.o. male past x-ray of eczema, HIV who presents for evaluation of laceration noted to his left arm.  He reports that about 20 minutes prior to ED arrival, he was raising his arm up and hit it on a broken porcelain pot.  He does not know when his last tetanus shot was.  He denies any numbness/weakness.  The history is provided by the patient.       Past Medical History:  Diagnosis Date  . HIV infection [facility])     Patient Active Problem List   Diagnosis Date Noted  . Healthcare maintenance 06/25/2020  . Eczema 04/06/2019  . Need for vaccination for bacterial disease 03/23/2017  . Medication monitoring encounter 10/01/2016  . Screening examination for venereal disease 09/27/2015  . Encounter for long-term (current) use of medications 09/27/2015  . Syphilis contact, treated 04/23/2015  . High blood pressure 01/16/2014  . Tattoos 07/29/2013  . Human immunodeficiency virus (HIV) disease (HCC) 07/29/2013    History reviewed. No pertinent surgical history.     Family History  Problem Relation Age of Onset  . Healthy Mother   . Healthy Father   . Healthy Brother     Social History   Tobacco Use  . Smoking status: Never Smoker  . Smokeless tobacco: Never Used  Substance Use Topics  . Alcohol use: Yes    Alcohol/week: 0.0 standard drinks    Comment: occasional  . Drug use: No    Frequency: 1.0 times per week    Home Medications Prior to Admission medications   Medication Sig Start Date End Date Taking? Authorizing Provider  elvitegravir-cobicistat-emtricitabine-tenofovir (GENVOYA) 150-150-200-10 MG TABS tablet Take 1 tablet by mouth daily with breakfast. 12/31/20   Veryl Speak, FNP    Allergies    Penicillins  Review of Systems    Review of Systems  Skin: Positive for wound.  Neurological: Negative for weakness and numbness.  All other systems reviewed and are negative.   Physical Exam Updated Vital Signs BP 136/89 (BP Location: Right Arm)   Pulse 91   Temp 98.2 F (36.8 C)   Resp 17   Ht 5\' 6"  (1.676 m)   Wt 73 kg   SpO2 100%   BMI 25.98 kg/m   Physical Exam Vitals and nursing note reviewed.  Constitutional:      Appearance: He is well-developed.  HENT:     Head: Normocephalic and atraumatic.  Eyes:     General: No scleral icterus.       Right eye: No discharge.        Left eye: No discharge.     Conjunctiva/sclera: Conjunctivae normal.  Cardiovascular:     Pulses:          Radial pulses are 2+ on the right side and 2+ on the left side.  Pulmonary:     Effort: Pulmonary effort is normal.  Musculoskeletal:     Comments: Edema, ecchymosis noted to the left forearm.  Flexion/tension of left elbow intact.  He also has ecchymosis noted to the humerus.  He can wiggle all 5 digits without any difficulty.  Skin:    General: Skin is warm and dry.     Capillary Refill: Capillary refill takes less than 2 seconds.  Comments: Good distal cap refill.  LUE is not dusky in appearance or cool to touch. 2 cm linear laceration noted to the left forearm, distal to the elbow.   Neurological:     Mental Status: He is alert.     Comments: Sensation intact along major nerve distributions of BLE  Psychiatric:        Speech: Speech normal.        Behavior: Behavior normal.     ED Results / Procedures / Treatments   Labs (all labs ordered are listed, but only abnormal results are displayed) Labs Reviewed  CBC WITH DIFFERENTIAL/PLATELET - Abnormal; Notable for the following components:      Result Value   WBC 16.3 (*)    Neutro Abs 14.2 (*)    All other components within normal limits  I-STAT CHEM 8, ED - Abnormal; Notable for the following components:   BUN 21 (*)    Creatinine, Ser 1.30 (*)     Glucose, Bld 133 (*)    Calcium, Ion 1.08 (*)    All other components within normal limits    EKG None  Radiology DG Forearm Left  Result Date: 01/15/2021 CLINICAL DATA:  Left forearm laceration EXAM: LEFT FOREARM - 2 VIEW COMPARISON:  None. FINDINGS: There is no evidence of fracture or other focal bone lesions. Soft tissues are unremarkable. IMPRESSION: Negative. Electronically Signed   By: Helyn Numbers MD   On: 01/15/2021 03:36    Procedures .[name] KitchenLaceration Repair  Date/Time: 01/15/2021 6:13 AM Performed by: Maxwell Caul, PA-C Authorized by: Maxwell Caul, PA-C   Consent:    Consent obtained:  Verbal   Consent given by:  Patient   Risks discussed:  Infection, need for additional repair, pain, poor cosmetic result and poor wound healing   Alternatives discussed:  No treatment and delayed treatment Universal protocol:    Procedure explained and questions answered to patient or proxy's satisfaction: yes     Relevant documents present and verified: yes     Test results available: yes     Imaging studies available: yes     Required blood products, implants, devices, and special equipment available: yes     Site/side marked: yes     Immediately prior to procedure, a time out was called: yes     Patient identity confirmed:  Verbally with patient Anesthesia:    Anesthesia method:  Local infiltration   Local anesthetic:  Lidocaine 2% WITH epi Laceration details:    Location:  Shoulder/arm   Shoulder/arm location:  L lower arm   Length (cm):  2 Pre-procedure details:    Preparation:  Patient was prepped and draped in usual sterile fashion Exploration:    Hemostasis achieved with:  Direct pressure   Imaging outcome: foreign body not noted     Wound extent: no foreign bodies/material noted and no muscle damage noted   Treatment:    Area cleansed with:  Povidone-iodine   Amount of cleaning:  Extensive   Irrigation solution:  Sterile saline Skin repair:    Repair  method:  Sutures   Suture size:  4-0   Suture material:  Prolene   Suture technique:  Simple interrupted   Number of sutures:  4 Approximation:    Approximation:  Close Repair type:    Repair type:  Simple Post-procedure details:    Dressing:  Antibiotic ointment and non-adherent dressing   Procedure completion:  Tolerated Comments:     Once wound was anesthetized, was  thoroughly extensively irrigated with sterile saline.  Evaluation of the wound through full range of motion showed no evidence of tendon damage.  No foreign body noted.     Medications Ordered in ED Medications  sodium chloride 0.9 % bolus 1,000 mL (has no administration in time range)  Tdap (BOOSTRIX) injection 0.5 mL (0.5 mLs Intramuscular Given 01/15/21 0519)  oxyCODONE-acetaminophen (PERCOCET/ROXICET) 5-325 MG per tablet 1 tablet (1 tablet Oral Given 01/15/21 0325)  lidocaine-EPINEPHrine (XYLOCAINE W/EPI) 2 %-1:200000 (PF) injection 10 mL (10 mLs Intradermal Given 01/15/21 2440)    ED Course  I have reviewed the triage vital signs and the nursing notes.  Pertinent labs & imaging results that were available during my care of the patient were reviewed by me and considered in my medical decision making (see chart for details).    MDM Rules/Calculators/A&P                          30 year old male who presents for evaluation of laceration to left forearm.  He reports that just prior to arrival, he was at home and states that he moved his arm up and hit it against a broken porcelain pot, causing a laceration.  He applied a tourniquet at home.  He reports that he is not having any numbness/weakness.  Does not know when his last tetanus shot was.  On initial arrival, he is afebrile, nontoxic-appearing.  Ulnar pulses easily palpable.  I was not able to obtain a palpable radial pulse but he has a good doppler radial pulse.  On exam, he has a 2 cm laceration forearm.  Initially, when I had MSE at him, he had some slight  ecchymosis to the arm but his compartments were soft.  On my evaluation in the main ED, his arm had some worsening edema, ecchymosis noted to the forearm as well as the humerus.  He does tell me that he had a tourniquet at 3 different spots prior to coming to the ED and has ecchymosis/ligature marks to LUE.  Question if there is a MSK injury.  He does have good sensation and can move all of his digits without any difficulty.  We will plan for further imaging of his upper extremity to assess for musculoskeletal/vascular injury.  Laceration repaired as documented above.  Patient tolerated procedure well.  X-ray forearm negative for any acute bony abnormality.  I-stat chem 8 shows BUN of 21, Cr of 1.30.   Patient signed out to Patrick Salts, PA-C with CTA pending.   Portions of this note were generated with Scientist, clinical (histocompatibility and immunogenetics). Dictation errors may occur despite best attempts at proofreading.   Final Clinical Impression(s) / ED Diagnoses Final diagnoses:  Arm laceration, left, initial encounter    Rx / DC Orders ED Discharge Orders    None       Rosana Hoes 01/15/21 1027    Shon Baton, MD 01/16/21 [phone]

## 2021-01-15 NOTE — ED Provider Notes (Addendum)
Care handoff received from [facility], PA-C at shift change.  Please see previous providers note for full details of visit.  30 year old male history of HIV and eczema presents for evaluation of left arm pain that occurred just prior to ED arrival.  He applied several tourniquets to his arm PTA.  Patient was neurovascular intact upon previous providers evaluation, laceration was repaired.  CT angiogram of the left arm ordered to ensure no vascular injury.  Patient seen and evaluated by Dr. Wilkie Aye who agrees with plan of discharge pending normal CT angiogram of the left upper extremity.  Tdap updated today.  Physical Exam  BP 136/89 (BP Location: Right Arm)   Pulse 91   Temp 98.2 F (36.8 C)   Resp 17   Ht 5\' 6"  (1.676 m)   Wt 73 kg   SpO2 100%   BMI 25.98 kg/m   Physical Exam Constitutional:      General: He is not in acute distress.    Appearance: Normal appearance. He is well-developed. He is not ill-appearing or diaphoretic.  HENT:     Head: Normocephalic and atraumatic.  Eyes:     General: Vision grossly intact. Gaze aligned appropriately.     Pupils: Pupils are equal, round, and reactive to light.  Neck:     Trachea: Trachea and phonation normal.  Pulmonary:     Effort: Pulmonary effort is normal. No respiratory distress.  Musculoskeletal:        General: Normal range of motion.     Cervical back: Normal range of motion.     Comments: Bruising and laceration present proximal left forearm.  Bruising left elbow.  ROM intact left shoulder, elbow, wrist and hand.  Capillary refill sensation intact to all fingers.  Radial pulse palpable left arm.  Passive range of motion intact without pain.  Compartments soft.  Skin:    General: Skin is warm and dry.  Neurological:     Mental Status: He is alert.     GCS: GCS eye subscore is 4. GCS verbal subscore is 5. GCS motor subscore is 6.     Comments: Speech is clear and goal oriented, follows commands Major Cranial nerves without  deficit, no facial droop Moves extremities without ataxia, coordination intact  Psychiatric:        Behavior: Behavior normal.     ED Course/Procedures     . Laceration Repair  Date/Time: 01/15/2021 7:51 AM Performed by: 01/17/2021, PA-C Authorized by: Bill Salinas, PA-C   Consent:    Consent obtained:  Verbal   Consent given by:  Patient   Risks, benefits, and alternatives were discussed: yes     Risks discussed:  Infection, pain, poor cosmetic result, retained foreign body, tendon damage, vascular damage, poor wound healing, need for additional repair and nerve damage Universal protocol:    Procedure explained and questions answered to patient or proxy's satisfaction: yes     Relevant documents present and verified: yes     Test results available: yes     Imaging studies available: yes     Required blood products, implants, devices, and special equipment available: yes     Immediately prior to procedure, a time out was called: yes     Patient identity confirmed:  Verbally with patient Laceration details:    Location:  Shoulder/arm   Shoulder/arm location:  L lower arm Pre-procedure details:    Preparation:  Patient was prepped and draped in usual sterile fashion Exploration:  Limited defect created (wound extended): no     Hemostasis achieved with:  Direct pressure   Wound extent: no foreign bodies/material noted, no muscle damage noted, no nerve damage noted, no tendon damage noted, no underlying fracture noted and no vascular damage noted   Treatment:    Area cleansed with:  Povidone-iodine Skin repair:    Repair method:  Sutures   Suture size:  4-0   Suture material:  Prolene   Suture technique:  Simple interrupted   Number of sutures:  1 Approximation:    Approximation:  Close Repair type:    Repair type:  Simple Post-procedure details:    Procedure completion:  Tolerated well, no immediate complications    MDM  7:40 AM: Called to patient's  room by RN for bleeding at patient's suture site.  I clean the area, patient's sutures are intact.  There is a hematoma at the site, no significant bleeding on my exam, I then placed a suture between sutures #2 and 3 placed by previous provider this stopped the bleeding. ------------- CTA Left Upper Extremity:    IMPRESSION:  1. No evidence of active extravasation or pseudoaneurysm.  2. 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  3. Ulnar artery crosses wrist as dominant blood supply to the hand.  4. Subcutaneous gas bubbles medial to the elbow.   Consult placed to vascular surgery.  Screening COVID test ordered. - 9:35 AM: Consult with Earney Hamburg PA-C with orthopedics, advises vascular surgery is appropriate next step. - 10:09 AM: Consult with vascular surgeon Dr. Edilia Bo, vascular team to be down shortly to see patient. - 10:34 AM: Patient seen and evaluated by vascular surgeon Dr. Edilia Bo who advises patient that patient is cleared for discharge advises elevation of extremity.   Patient reassessed he is resting company in bed no acute distress, states understanding of care plan is agreeable for discharge At this time there does not appear to be any evidence of an acute emergency medical condition and the patient appears stable for discharge with appropriate outpatient follow up. Diagnosis was discussed with patient who verbalizes understanding of care plan and is agreeable to discharge. I have discussed return precautions with patient who verbalizes understanding. Patient encouraged to follow-up with their PCP and ortho. All questions answered.  Patient's case discussed with attending ER physician Dr. Denton Lank who agrees with plan to discharge with follow-up.   Addendum: 11:44 AM: Called patient and gave him contact information for [facility] for follow-up as well.  Note: Portions of this  report may have been transcribed using voice recognition software. Every effort was made to ensure accuracy; however, inadvertent computerized transcription errors may still be present.   Bill Salinas, PA-C 01/15/21 1044    [address] 01/15/21 1145    Shon Baton, MD 01/16/21 [phone]

## 2021-01-24 ENCOUNTER — Telehealth: Payer: Self-pay

## 2021-01-24 NOTE — Telephone Encounter (Signed)
Patient states he went to ED last week for arm laceration, received stitches and did not follow up with [facility] and Dr. Carola Frost as told in the ED. Patient states area now looks "infected" . Denies any fevers, chills. States the area is tender to touch with no odors.   Patient unable to come in today, but accepts appointment for tomorrow with Dr. Daiva Eves.   Patient also missed last HIV follow up visit with Tammy Sours and will also need lab work.  Valarie Cones

## 2021-01-25 ENCOUNTER — Encounter: Payer: Self-pay | Admitting: Infectious Disease

## 2021-01-25 ENCOUNTER — Ambulatory Visit (INDEPENDENT_AMBULATORY_CARE_PROVIDER_SITE_OTHER): Payer: Self-pay | Admitting: Infectious Disease

## 2021-01-25 ENCOUNTER — Other Ambulatory Visit: Payer: Self-pay

## 2021-01-25 VITALS — BP 158/99 | HR 101 | Resp 16 | Ht 66.0 in | Wt 168.0 lb

## 2021-01-25 DIAGNOSIS — S41112A Laceration without foreign body of left upper arm, initial encounter: Secondary | ICD-10-CM | POA: Insufficient documentation

## 2021-01-25 DIAGNOSIS — L0291 Cutaneous abscess, unspecified: Secondary | ICD-10-CM

## 2021-01-25 DIAGNOSIS — B20 Human immunodeficiency virus [HIV] disease: Secondary | ICD-10-CM

## 2021-01-25 HISTORY — DX: Laceration without foreign body of left upper arm, initial encounter: S41.112A

## 2021-01-25 HISTORY — DX: Cutaneous abscess, unspecified: L02.91

## 2021-01-25 MED ORDER — AMOXICILLIN-POT CLAVULANATE 875-125 MG PO TABS: 1.0000 | ORAL_TABLET | Freq: Two times a day (BID) | ORAL | 1 refills | Status: DC

## 2021-01-25 MED ORDER — DOXYCYCLINE HYCLATE 100 MG PO TABS: 100.0000 mg | ORAL_TABLET | Freq: Two times a day (BID) | ORAL | 1 refills | Status: DC

## 2021-01-25 NOTE — Progress Notes (Signed)
Subjective:  Chief complaint concern of infection at the site where he had a laceration that was sutured  Patient ID: Patrick Dunlap, male    DOB: Jun 30, 1991, 30 y.o.   MRN: [id number]  HPI  Patrick Dunlap is a 30 year old black man living with HIV that is been perfectly controlled on Genvoya.  He sustained a laceration to his left upper extremity recently was seen in the ER on January 15, 2021.  He underwent CT angiogram which showed no evidence of extravasation or pseudoaneurysm with a small occlusion of diminutive radial artery in the proximal forearm without evidence of distal reconstitution.  Duration was sutured shut.  Vascular surgery also were involved.  He however now is developed some fluctuance underneath where the sutures were.  He remove the sutures himself.  He is not have fevers or other systemic symptoms but he clearly has an abscess on his forearm.    Past Medical History:  Diagnosis Date  . Abscess 01/25/2021  . HIV infection (HCC)   . Laceration of left arm with complication 01/25/2021    No past surgical history on file.  Family History  Problem Relation Age of Onset  . Healthy Mother   . Healthy Father   . Healthy Brother       Social History   Socioeconomic History  . Marital status: Single    Spouse name: Not on file  . Number of children: Not on file  . Years of education: Not on file  . Highest education level: Not on file  Occupational History  . Not on file  Tobacco Use  . Smoking status: Never Smoker  . Smokeless tobacco: Never Used  Substance and Sexual Activity  . Alcohol use: Yes    Alcohol/week: 0.0 standard drinks    Comment: occasional  . Drug use: No    Frequency: 1.0 times per week  . Sexual activity: Yes    Partners: Male    Birth control/protection: None    Comment: pt. given condoms  Other Topics Concern  . Not on file  Social History Narrative  . Not on file   Social Determinants of Health   Financial Resource Strain:  Not on file  Food Insecurity: Not on file  Transportation Needs: Not on file  Physical Activity: Not on file  Stress: Not on file  Social Connections: Not on file    No Active Allergies   Current Outpatient Medications:  .  amoxicillin-clavulanate (AUGMENTIN) 875-125 MG tablet, Take 1 tablet by mouth 2 (two) times daily., Disp: 28 tablet, Rfl: 1 .  doxycycline (VIBRA-TABS) 100 MG tablet, Take 1 tablet (100 mg total) by mouth 2 (two) times daily., Disp: 28 tablet, Rfl: 1 .  elvitegravir-cobicistat-emtricitabine-tenofovir (GENVOYA) 150-150-200-10 MG TABS tablet, Take 1 tablet by mouth daily with breakfast., Disp: 30 tablet, Rfl: 0   Review of Systems  Constitutional: Negative for chills and fever.  HENT: Negative for congestion and sore throat.   Eyes: Negative for photophobia.  Respiratory: Negative for cough, shortness of breath and wheezing.   Cardiovascular: Negative for chest pain, palpitations and leg swelling.  Gastrointestinal: Negative for abdominal pain, blood in stool, constipation, diarrhea, nausea and vomiting.  Genitourinary: Negative for dysuria, flank pain and hematuria.  Musculoskeletal: Negative for back pain and myalgias.  Skin: Positive for wound. Negative for rash.  Neurological: Negative for dizziness, weakness and headaches.  Hematological: Does not bruise/bleed easily.  Psychiatric/Behavioral: Negative for agitation, behavioral problems, confusion, decreased concentration, dysphoric mood, hallucinations and suicidal  ideas. The patient is not nervous/anxious and is not hyperactive.        Objective:   Physical Exam Constitutional:      General: He is not in acute distress.    Appearance: Normal appearance. He is well-developed. He is not ill-appearing or diaphoretic.  HENT:     Head: Normocephalic and atraumatic.     Right Ear: Hearing and external ear normal.     Left Ear: Hearing and external ear normal.     Nose: No nasal deformity or rhinorrhea.   Eyes:     General: No scleral icterus.    Conjunctiva/sclera: Conjunctivae normal.     Right eye: Right conjunctiva is not injected.     Left eye: Left conjunctiva is not injected.     Pupils: Pupils are equal, round, and reactive to light.  Neck:     Vascular: No JVD.  Cardiovascular:     Rate and Rhythm: Normal rate and regular rhythm.     Heart sounds: Normal heart sounds, S1 normal and S2 normal. No murmur heard. No friction rub.  Pulmonary:     Effort: Pulmonary effort is normal. No respiratory distress.     Breath sounds: No wheezing.  Abdominal:     General: Bowel sounds are normal. There is no distension.     Palpations: Abdomen is soft.     Tenderness: There is no abdominal tenderness.  Musculoskeletal:        General: Normal range of motion.     Right shoulder: Normal.     Left shoulder: Normal.     Cervical back: Normal range of motion and neck supple.     Right hip: Normal.     Left hip: Normal.     Right knee: Normal.     Left knee: Normal.  Lymphadenopathy:     Head:     Right side of head: No submandibular, preauricular or posterior auricular adenopathy.     Left side of head: No submandibular, preauricular or posterior auricular adenopathy.     Cervical: No cervical adenopathy.     Right cervical: No superficial or deep cervical adenopathy.    Left cervical: No superficial or deep cervical adenopathy.  Skin:    General: Skin is warm and dry.     Coloration: Skin is not pale.     Findings: No abrasion, bruising, ecchymosis, lesion or rash.     Nails: There is no clubbing.  Neurological:     General: No focal deficit present.     Mental Status: He is alert and oriented to person, place, and time.     Sensory: No sensory deficit.     Coordination: Coordination normal.     Gait: Gait normal.  Psychiatric:        Attention and Perception: He is attentive.        Mood and Affect: Mood normal.        Speech: Speech normal.        Behavior: Behavior normal.  Behavior is cooperative.        Thought Content: Thought content normal.        Judgment: Judgment normal.     Left forearm 01/25/2021:         Assessment & Plan:   Abscess: We will give him doxycycline and Augmentin for now bring him back to clinic next week and I suspect he will need an I&D  HIV disease: Check labs today continue Genvoya

## 2021-01-28 LAB — COMPLETE METABOLIC PANEL WITH GFR
AG Ratio: 1.7 (calc) (ref 1.0–2.5)
ALT: 42 U/L (ref 9–46)
AST: 28 U/L (ref 10–40)
Albumin: 4.4 g/dL (ref 3.6–5.1)
Alkaline phosphatase (APISO): 50 U/L (ref 36–130)
BUN: 17 mg/dL (ref 7–25)
CO2: 29 mmol/L (ref 20–32)
Calcium: 9.9 mg/dL (ref 8.6–10.3)
Chloride: 105 mmol/L (ref 98–110)
Creat: 1.03 mg/dL (ref 0.60–1.35)
GFR, Est African American: 112 mL/min/{1.73_m2} (ref >=60)
GFR, Est Non African American: 97 mL/min/{1.73_m2} (ref >=60)
Globulin: 2.6 g/dL (calc) (ref 1.9–3.7)
Glucose, Bld: 92 mg/dL (ref 65–99)
Potassium: 4.2 mmol/L (ref 3.5–5.3)
Sodium: 141 mmol/L (ref 135–146)
Total Bilirubin: 0.5 mg/dL (ref 0.2–1.2)
Total Protein: 7 g/dL (ref 6.1–8.1)

## 2021-01-28 LAB — CBC WITH DIFFERENTIAL/PLATELET
Absolute Monocytes: 824 cells/uL (ref 200–950)
Basophils Absolute: 41 cells/uL (ref 0–200)
Basophils Relative: 0.4 %
Eosinophils Absolute: 237 cells/uL (ref 15–500)
Eosinophils Relative: 2.3 %
HCT: 40 % (ref 38.5–50.0)
Hemoglobin: 13.4 g/dL (ref 13.2–17.1)
Lymphs Abs: 1679 cells/uL (ref 850–3900)
MCH: 30.6 pg (ref 27.0–33.0)
MCHC: 33.5 g/dL (ref 32.0–36.0)
MCV: 91.3 fL (ref 80.0–100.0)
MPV: 10.3 fL (ref 7.5–12.5)
Monocytes Relative: 8 %
Neutro Abs: 7519 cells/uL (ref 1500–7800)
Neutrophils Relative %: 73 %
Platelets: 342 10*3/uL (ref 140–400)
RBC: 4.38 10*6/uL (ref 4.20–5.80)
RDW: 12.6 % (ref 11.0–15.0)
Total Lymphocyte: 16.3 %
WBC: 10.3 10*3/uL (ref 3.8–10.8)

## 2021-01-28 LAB — URINE CYTOLOGY ANCILLARY ONLY
Chlamydia: NEGATIVE
Comment: NEGATIVE
Comment: NORMAL
Neisseria Gonorrhea: NEGATIVE

## 2021-01-28 LAB — T-HELPER CELLS (CD4) COUNT (NOT AT ARMC)
Absolute CD4: 641 cells/uL (ref 490–1740)
CD4 T Helper %: 37 % (ref 30–61)
Total lymphocyte count: 1724 cells/uL (ref 850–3900)

## 2021-01-28 LAB — HIV-1 RNA QUANT-NO REFLEX-BLD
HIV 1 RNA Quant: <20 Copies/mL — ABNORMAL HIGH
HIV-1 RNA Quant, Log: <1.3 Log cps/mL — ABNORMAL HIGH

## 2021-01-28 LAB — RPR: RPR Ser Ql: NONREACTIVE

## 2021-02-01 ENCOUNTER — Ambulatory Visit: Payer: Self-pay | Admitting: Infectious Disease

## 2021-04-11 ENCOUNTER — Other Ambulatory Visit: Payer: Self-pay

## 2021-04-11 ENCOUNTER — Ambulatory Visit: Payer: Self-pay

## 2021-04-12 ENCOUNTER — Encounter: Payer: Self-pay | Admitting: Internal Medicine

## 2021-06-20 ENCOUNTER — Other Ambulatory Visit: Payer: Self-pay

## 2021-06-20 DIAGNOSIS — B20 Human immunodeficiency virus [HIV] disease: Secondary | ICD-10-CM

## 2021-06-20 DIAGNOSIS — Z79899 Other long term (current) drug therapy: Secondary | ICD-10-CM

## 2021-06-26 ENCOUNTER — Other Ambulatory Visit: Payer: Self-pay

## 2021-07-10 ENCOUNTER — Encounter: Payer: Self-pay | Admitting: Internal Medicine

## 2021-07-27 ENCOUNTER — Other Ambulatory Visit: Payer: Self-pay | Admitting: Family

## 2021-07-27 DIAGNOSIS — B20 Human immunodeficiency virus [HIV] disease: Secondary | ICD-10-CM

## 2021-07-30 ENCOUNTER — Other Ambulatory Visit: Payer: Self-pay

## 2021-08-21 ENCOUNTER — Encounter: Payer: Self-pay | Admitting: Internal Medicine

## 2021-10-08 ENCOUNTER — Other Ambulatory Visit: Payer: Self-pay | Admitting: Family

## 2021-10-08 DIAGNOSIS — B20 Human immunodeficiency virus [HIV] disease: Secondary | ICD-10-CM

## 2021-10-11 ENCOUNTER — Other Ambulatory Visit: Payer: Self-pay

## 2021-10-11 DIAGNOSIS — Z113 Encounter for screening for infections with a predominantly sexual mode of transmission: Secondary | ICD-10-CM

## 2021-10-11 DIAGNOSIS — B20 Human immunodeficiency virus [HIV] disease: Secondary | ICD-10-CM

## 2021-10-14 LAB — CBC WITH DIFFERENTIAL/PLATELET
Absolute Monocytes: 587 cells/uL (ref 200–950)
Basophils Absolute: 33 cells/uL (ref 0–200)
Basophils Relative: 0.5 %
Eosinophils Absolute: 257 cells/uL (ref 15–500)
Eosinophils Relative: 3.9 %
HCT: 47 % (ref 38.5–50.0)
Hemoglobin: 16.1 g/dL (ref 13.2–17.1)
Lymphs Abs: 2092 cells/uL (ref 850–3900)
MCH: 30.6 pg (ref 27.0–33.0)
MCHC: 34.3 g/dL (ref 32.0–36.0)
MCV: 89.4 fL (ref 80.0–100.0)
MPV: 10.4 fL (ref 7.5–12.5)
Monocytes Relative: 8.9 %
Neutro Abs: 3630 cells/uL (ref 1500–7800)
Neutrophils Relative %: 55 %
Platelets: 275 10*3/uL (ref 140–400)
RBC: 5.26 10*6/uL (ref 4.20–5.80)
RDW: 12.8 % (ref 11.0–15.0)
Total Lymphocyte: 31.7 %
WBC: 6.6 10*3/uL (ref 3.8–10.8)

## 2021-10-14 LAB — LIPID PANEL
Cholesterol: 238 mg/dL — ABNORMAL HIGH (ref <200)
HDL: 56 mg/dL (ref >=40)
LDL Cholesterol (Calc): 150 mg/dL (calc) — ABNORMAL HIGH
Non-HDL Cholesterol (Calc): 182 mg/dL (calc) — ABNORMAL HIGH (ref <130)
Total CHOL/HDL Ratio: 4.3 (calc) (ref <5.0)
Triglycerides: 183 mg/dL — ABNORMAL HIGH (ref <150)

## 2021-10-14 LAB — COMPLETE METABOLIC PANEL WITH GFR
AG Ratio: 1.8 (calc) (ref 1.0–2.5)
ALT: 62 U/L — ABNORMAL HIGH (ref 9–46)
AST: 42 U/L — ABNORMAL HIGH (ref 10–40)
Albumin: 4.7 g/dL (ref 3.6–5.1)
Alkaline phosphatase (APISO): 51 U/L (ref 36–130)
BUN: 16 mg/dL (ref 7–25)
CO2: 31 mmol/L (ref 20–32)
Calcium: 9.6 mg/dL (ref 8.6–10.3)
Chloride: 104 mmol/L (ref 98–110)
Creat: 0.94 mg/dL (ref 0.60–1.26)
Globulin: 2.6 g/dL (calc) (ref 1.9–3.7)
Glucose, Bld: 93 mg/dL (ref 65–99)
Potassium: 4.3 mmol/L (ref 3.5–5.3)
Sodium: 141 mmol/L (ref 135–146)
Total Bilirubin: 0.5 mg/dL (ref 0.2–1.2)
Total Protein: 7.3 g/dL (ref 6.1–8.1)
eGFR: 112 mL/min/{1.73_m2} (ref >=60)

## 2021-10-14 LAB — RPR: RPR Ser Ql: NONREACTIVE

## 2021-10-14 LAB — T-HELPER CELLS (CD4) COUNT (NOT AT ARMC)
Absolute CD4: 568 cells/uL (ref 490–1740)
CD4 T Helper %: 27 % — ABNORMAL LOW (ref 30–61)
Total lymphocyte count: 2089 cells/uL (ref 850–3900)

## 2021-10-14 LAB — HIV-1 RNA QUANT-NO REFLEX-BLD
HIV 1 RNA Quant: 34 Copies/mL — ABNORMAL HIGH
HIV-1 RNA Quant, Log: 1.53 Log cps/mL — ABNORMAL HIGH

## 2021-10-14 LAB — URINE CYTOLOGY ANCILLARY ONLY
Chlamydia: NEGATIVE
Comment: NEGATIVE
Comment: NORMAL
Neisseria Gonorrhea: NEGATIVE

## 2021-11-05 ENCOUNTER — Encounter: Payer: Self-pay | Admitting: Internal Medicine

## 2021-11-09 ENCOUNTER — Other Ambulatory Visit: Payer: Self-pay | Admitting: Family

## 2021-11-09 DIAGNOSIS — B20 Human immunodeficiency virus [HIV] disease: Secondary | ICD-10-CM

## 2021-11-15 ENCOUNTER — Ambulatory Visit: Payer: Self-pay | Admitting: Family

## 2021-11-20 ENCOUNTER — Ambulatory Visit: Payer: Self-pay

## 2021-11-20 ENCOUNTER — Ambulatory Visit (INDEPENDENT_AMBULATORY_CARE_PROVIDER_SITE_OTHER): Payer: Self-pay | Admitting: Family

## 2021-11-20 ENCOUNTER — Other Ambulatory Visit: Payer: Self-pay

## 2021-11-20 ENCOUNTER — Encounter: Payer: Self-pay | Admitting: Family

## 2021-11-20 VITALS — BP 167/123 | HR 108 | Temp 99.4°F | Resp 16 | Ht 67.0 in | Wt 167.6 lb

## 2021-11-20 DIAGNOSIS — Z0000 Encounter for general adult medical examination without abnormal findings: Secondary | ICD-10-CM

## 2021-11-20 DIAGNOSIS — Z113 Encounter for screening for infections with a predominantly sexual mode of transmission: Secondary | ICD-10-CM

## 2021-11-20 DIAGNOSIS — B20 Human immunodeficiency virus [HIV] disease: Secondary | ICD-10-CM

## 2021-11-20 MED ORDER — GENVOYA 150-150-200-10 MG PO TABS: 1.0000 | ORAL_TABLET | Freq: Every day | ORAL | 6 refills | Status: DC

## 2021-11-20 NOTE — Patient Instructions (Addendum)
Nice to see you. ? ?We will check your lab work today. ? ?Continue to take your medication daily as prescribed. ? ?Refills have been sent to the pharmacy. ? ?Plan for follow up in 6 months or sooner if needed with lab work on the same day. ? ?Have a great day and stay safe! ? ?

## 2021-11-20 NOTE — Assessment & Plan Note (Signed)
Mr. Deemer continues to have well-controlled virus with good adherence and tolerance with ART regimen of Genvoya.  No signs/symptoms of opportunistic infection.  We reviewed previous lab work and discussed plan of care.  Continue current dose of Genvoya.  Financial assistance renewed.  Plan for follow-up in 6 months or sooner if needed with lab work on the same day. ?

## 2021-11-20 NOTE — Progress Notes (Signed)
? ? ?Brief Narrative  ? ?Patient ID: Patrick Dunlap, male    DOB: 22-Apr-1991, 31 y.o.   MRN: [id number] ? ?Patrick Dunlap is a 31 y/o male diagnosed with HIV disease in October 2014 with risk factor of MSM. Initial viral load was 27,166 with CD4 count of 210. Initial genotype with no significant medication resistance mutations. No history of opportunistic infection. Previous ART history with Stribild and Genvoya.  ? ?Subjective:  ?  ?Chief Complaint  ?Patient presents with  ? Follow-up  ?  B20   ? ? ?HPI: ? ?Patrick Dunlap is a 31 y.o. male with HIV disease last seen on 01/25/21 by Dr. Tommy Medal for abscess and last for HIV care on 06/25/20 with good adherence and tolerance to his ART regimen of Genvoya. Viral load was undetectable and CD4 count was 496. Most recent lab work completed on 10/11/21 with CDD4 count of 568 and viral load that was undetectable. Here today for follow up.  ? ?Patrick Dunlap continues to take his Jorje Guild daily as prescribed with no adverse side effects.  Overall feeling well today with no new concerns/complaints. Denies fevers, chills, night sweats, headaches, changes in vision, neck pain/stiffness, nausea, diarrhea, vomiting, lesions or rashes. ? ?Patrick Dunlap has no problems obtaining medication from the pharmacy and remains covered by [employer].  Denies feelings of being down, depressed, or hopeless recently.  No current recreational illicit drug use, tobacco use, and occasional alcohol consumption.  Condoms provided.  Healthcare maintenance due includes Pneumovax and influenza vaccinations. ? ? ?Allergies  ?Allergen Reactions  ? Penicillins Rash  ?  Has had pcn since then  ? ? ? ? ?Outpatient Medications Prior to Visit  ?Medication Sig Dispense Refill  ? GENVOYA 150-150-200-10 MG TABS tablet TAKE 1 TABLET BY MOUTH DAILY WITH BREAKFAST 30 tablet 0  ? amoxicillin-clavulanate (AUGMENTIN) 875-125 MG tablet Take 1 tablet by mouth 2 (two) times daily. (Patient not taking: Reported on 11/20/2021) 28  tablet 1  ? doxycycline (VIBRA-TABS) 100 MG tablet Take 1 tablet (100 mg total) by mouth 2 (two) times daily. (Patient not taking: Reported on 11/20/2021) 28 tablet 1  ? ?No facility-administered medications prior to visit.  ? ? ? ?Past Medical History:  ?Diagnosis Date  ? Abscess 01/25/2021  ? HIV infection ([place])   ? Laceration of left arm with complication 123XX123  ? ? ? ?History reviewed. No pertinent surgical history. ? ? ? ?Review of Systems  ?Constitutional:  Negative for appetite change, chills, fatigue, fever and unexpected weight change.  ?Eyes:  Negative for visual disturbance.  ?Respiratory:  Negative for cough, chest tightness, shortness of breath and wheezing.   ?Cardiovascular:  Negative for chest pain and leg swelling.  ?Gastrointestinal:  Negative for abdominal pain, constipation, diarrhea, nausea and vomiting.  ?Genitourinary:  Negative for dysuria, flank pain, frequency, genital sores, hematuria and urgency.  ?Skin:  Negative for rash.  ?Allergic/Immunologic: Negative for immunocompromised state.  ?Neurological:  Negative for dizziness and headaches.  ?   ?Objective:  ?  ?BP (!) 167/123   Pulse (!) 108   Temp 99.4 ?F (37.4 ?C) (Oral)   Resp 16   Ht 5\' 7"  (1.702 m)   Wt 167 lb 9.6 oz (76 kg)   SpO2 98%   BMI 26.25 kg/m?  ?Nursing note and vital signs reviewed. ? ?Physical Exam ?Constitutional:   ?   General: He is not in acute distress. ?   Appearance: He is well-developed.  ?Eyes:  ?  Conjunctiva/sclera: Conjunctivae normal.  ?Cardiovascular:  ?   Rate and Rhythm: Normal rate and regular rhythm.  ?   Heart sounds: Normal heart sounds. No murmur heard. ?  No friction rub. No gallop.  ?Pulmonary:  ?   Effort: Pulmonary effort is normal. No respiratory distress.  ?   Breath sounds: Normal breath sounds. No wheezing or rales.  ?Chest:  ?   Chest wall: No tenderness.  ?Abdominal:  ?   General: Bowel sounds are normal.  ?   Palpations: Abdomen is soft.  ?   Tenderness: There is no abdominal  tenderness.  ?Musculoskeletal:  ?   Cervical back: Neck supple.  ?Lymphadenopathy:  ?   Cervical: No cervical adenopathy.  ?Skin: ?   General: Skin is warm and dry.  ?   Findings: No rash.  ?Neurological:  ?   Mental Status: He is alert and oriented to person, place, and time.  ?Psychiatric:     ?   Behavior: Behavior normal.     ?   Thought Content: Thought content normal.     ?   Judgment: Judgment normal.  ? ? ? ?Depression screen [facility] 2/9 11/20/2021 08/30/2018 03/23/2017 10/01/2016 04/02/2016  ?Decreased Interest 0 0 0 0 0  ?Down, Depressed, Hopeless 0 0 0 0 0  ?PHQ - 2 Score 0 0 0 0 0  ?Altered sleeping - - - - -  ?Tired, decreased energy - - - - -  ?Change in appetite - - - - -  ?Feeling bad or failure about yourself  - - - - -  ?Trouble concentrating - - - - -  ?Moving slowly or fidgety/restless - - - - -  ?Suicidal thoughts - - - - -  ?PHQ-9 Score - - - - -  ?  ?   ?Assessment & Plan:  ? ? ?Patient Active Problem List  ? Diagnosis Date Noted  ? Abscess 01/25/2021  ? Laceration of left arm with complication [id number]  ? Healthcare maintenance 06/25/2020  ? Eczema 04/06/2019  ? Need for vaccination for bacterial disease 03/23/2017  ? Medication monitoring encounter 10/01/2016  ? Screening examination for venereal disease 09/27/2015  ? Encounter for long-term (current) use of medications 09/27/2015  ? Syphilis contact, treated 04/23/2015  ? High blood pressure 01/16/2014  ? Tattoos 07/29/2013  ? Human immunodeficiency virus (HIV) disease ([place]) 07/29/2013  ? ? ? ?Problem List Items Addressed This Visit   ? ?  ? Other  ? Human immunodeficiency virus (HIV) disease ([place]) - Primary  ?  Patrick Dunlap continues to have well-controlled virus with good adherence and tolerance with ART regimen of Genvoya.  No signs/symptoms of opportunistic infection.  We reviewed previous lab work and discussed plan of care.  Continue current dose of Genvoya.  Financial assistance renewed.  Plan for follow-up in 6 months or sooner if needed with  lab work on the same day. ?  ?  ? Relevant Medications  ? elvitegravir-cobicistat-emtricitabine-tenofovir (GENVOYA) 150-150-200-10 MG TABS tablet  ? Healthcare maintenance  ?  Discussed importance of safe sexual practices and condom use.  Condoms offered and provided. ?Declines vaccinations. ?Encouraged routine dental care and can refer to [facility] if needed.  ?  ?  ? ?Other Visit Diagnoses   ? ? Screening for STDs (sexually transmitted diseases)      ? Relevant Orders  ? GC/CT Probe, [facility] (Throat)  ? C. trachomatis/N. gonorrhoeae RNA  ? ?  ? ? ? ?I have discontinued Patrick J.  Dunlap's doxycycline and amoxicillin-clavulanate. I have also changed his Genvoya. ? ? ?Meds ordered this encounter  ?Medications  ? elvitegravir-cobicistat-emtricitabine-tenofovir (GENVOYA) 150-150-200-10 MG TABS tablet  ?  Sig: Take 1 tablet by mouth daily with breakfast.  ?  Dispense:  30 tablet  ?  Refill:  6  ?  Order Specific Question:   Supervising Provider  ?  Answer:   Carlyle Basques [[id number]]  ? ? ? ?Follow-up: Return in about 6 months (around 05/23/2022), or if symptoms worsen or fail to improve. ? ? ?Terri Piedra, MSN, FNP-C ?Nurse Practitioner ?[facility] ?[facility] ?[facility] Main number: [phone] ? ? ?

## 2021-11-20 NOTE — Assessment & Plan Note (Signed)
?   Discussed importance of safe sexual practices and condom use.  Condoms offered and provided. ?? Declines vaccinations. ?? Encouraged routine dental care and can refer to [facility] if needed.  ?

## 2021-11-21 ENCOUNTER — Telehealth: Payer: Self-pay

## 2021-11-21 LAB — GC/CHLAMYDIA PROBE, AMP (THROAT)
Chlamydia trachomatis RNA: NOT DETECTED
Neisseria gonorrhoeae RNA: DETECTED — AB

## 2021-11-21 LAB — C. TRACHOMATIS/N. GONORRHOEAE RNA
C. trachomatis RNA, TMA: NOT DETECTED
N. gonorrhoeae RNA, TMA: NOT DETECTED

## 2021-11-21 NOTE — Telephone Encounter (Signed)
-----   Message from Veryl Speak, FNP sent at 11/21/2021  1:51 PM EST ----- ?Please inform Patrick Dunlap that his urine was positive for gonorrhea. He will need Ceftriaxone 500 mg IM once please.  ?

## 2021-11-21 NOTE — Telephone Encounter (Signed)
Called patient to relay results, no answer. Left HIPAA compliant voicemail requesting callback.   Patrick Dunlap D Patrick Renier, RN  

## 2021-11-22 ENCOUNTER — Other Ambulatory Visit: Payer: Self-pay

## 2021-11-22 ENCOUNTER — Ambulatory Visit (INDEPENDENT_AMBULATORY_CARE_PROVIDER_SITE_OTHER): Payer: Self-pay

## 2021-11-22 DIAGNOSIS — A549 Gonococcal infection, unspecified: Secondary | ICD-10-CM

## 2021-11-22 MED ORDER — CEFTRIAXONE SODIUM 500 MG IJ SOLR
500.0000 mg | Freq: Once | INTRAMUSCULAR | Status: AC
  Administered 2021-11-22: 500 mg via INTRAMUSCULAR

## 2021-11-22 NOTE — Progress Notes (Signed)
Reviewed and verified allergies with patient. Patient tolerated ceftriaxone well, has received this medication previously per chart review. Reinforced abstinence for 7 days after treatment, offered condoms and encouraged use. Advised patient to notify sexual partners for testing and treatment. Patient verbalized understanding.  ? ?[name], RN ? ? ?

## 2022-05-29 ENCOUNTER — Ambulatory Visit: Payer: Self-pay | Admitting: Family

## 2022-05-30 ENCOUNTER — Ambulatory Visit: Payer: Self-pay | Admitting: Family

## 2022-06-02 ENCOUNTER — Ambulatory Visit: Payer: Self-pay | Admitting: Family

## 2022-12-05 ENCOUNTER — Telehealth: Payer: Self-pay | Admitting: Pharmacist

## 2022-12-05 ENCOUNTER — Encounter: Payer: Self-pay | Admitting: Physician Assistant

## 2022-12-05 ENCOUNTER — Other Ambulatory Visit: Payer: Self-pay

## 2022-12-05 ENCOUNTER — Other Ambulatory Visit (HOSPITAL_COMMUNITY): Payer: Self-pay

## 2022-12-05 ENCOUNTER — Ambulatory Visit (INDEPENDENT_AMBULATORY_CARE_PROVIDER_SITE_OTHER): Payer: Self-pay | Admitting: Physician Assistant

## 2022-12-05 VITALS — BP 167/114 | HR 88 | Temp 98.6°F | Wt 184.0 lb

## 2022-12-05 DIAGNOSIS — B20 Human immunodeficiency virus [HIV] disease: Secondary | ICD-10-CM

## 2022-12-05 DIAGNOSIS — Z113 Encounter for screening for infections with a predominantly sexual mode of transmission: Secondary | ICD-10-CM

## 2022-12-05 DIAGNOSIS — I10 Essential (primary) hypertension: Secondary | ICD-10-CM

## 2022-12-05 MED ORDER — GENVOYA 150-150-200-10 MG PO TABS: 1.0000 | ORAL_TABLET | Freq: Every day | ORAL | 6 refills | Status: DC

## 2022-12-05 MED ORDER — HYDROCHLOROTHIAZIDE 12.5 MG PO CAPS: 12.5000 mg | ORAL_CAPSULE | Freq: Every day | ORAL | 2 refills | Status: DC

## 2022-12-05 NOTE — Telephone Encounter (Signed)
Counseled that [place] is two separate intramuscular injections in the gluteal muscle on each side for each visit. Explained that the second injection is 30 days after the initial injection then every 2 months thereafter. Discussed the need for viral load monitoring every 2 months for the first 6 months and then periodically afterwards as their provider sees the need. Discussed the rare but significant chance of developing resistance despite compliance. Explained that showing up to injection appointments is very important and warned that if 2 appointments are missed, it will be reassessed by their provider whether they are a good candidate for injection therapy. Counseled on possible side effects associated with the injections such as injection site pain, which is usually mild to moderate in nature, injection site nodules, and injection site reactions. Asked to call the clinic or send me a mychart message if they experience any issues, such as fatigue, nausea, headache, rash, or dizziness. Advised that they can take ibuprofen or tylenol for injection site pain if needed.   Patient is uninsured and approved with [employer]. He wants to think on and research this medicine more before committing. Gave him my card and told him to call us or send a MyChart message if he decides to start injections.   Alfonse Spruce, PharmD, CPP, BCIDP, [place] Clinical Pharmacist Practitioner [facility]

## 2022-12-05 NOTE — Progress Notes (Signed)
Subjective:    Patient ID: Patrick Dunlap, male    DOB: 06-09-1991, 32 y.o.   MRN: [id number]  Chief Complaint  Patient presents with   Follow-up     HPI:  Patrick Dunlap is a 32 y.o. male with well controlled HIV-1, adherent to genvoya without any missed doses and tolerating well. Would like to discuss initiating cabanuva. Last visit 10/11/21 CD4 568 and VL 34. Last Genotype 2014. He was last sexually active with a male 3 months ago engaged in anal intercourse with condom however he would like STI screening no swabs. No illicit drug use Alcohol socially 2-3 times per month No smoking Not working currently Not in school Living well and has full support of family and friends ADAP not up to date will speak with financial today to apply HTN: has been treated for HTN, discontinued amlodipne due to intolerance. He has had intermittent headfaches and weight gain. He reports father has HTN and admits to poor diet in the last year.  Blood pressure 160/115 elevated today (checked twice) has been this way since 2019 does report he becomes nervous at clinic. Dental visit > 1 year ago would like referral to dental clinic Vaccines: will not today      Allergies  Allergen Reactions   Penicillins Rash    Has had pcn since then      Outpatient Medications Prior to Visit  Medication Sig Dispense Refill   elvitegravir-cobicistat-emtricitabine-tenofovir (GENVOYA) 150-150-200-10 MG TABS tablet Take 1 tablet by mouth daily with breakfast. 30 tablet 6   No facility-administered medications prior to visit.     Past Medical History:  Diagnosis Date   Abscess 01/25/2021   HIV infection ([place])    Laceration of left arm with complication 123XX123     No past surgical history on file.     Review of Systems  Constitutional:  Negative for activity change, chills, fatigue and fever.  HENT:  Negative for mouth sores and sinus pressure.   Respiratory:  Negative for shortness  of breath and wheezing.   Cardiovascular:  Negative for chest pain, palpitations and leg swelling.  Gastrointestinal:  Negative for abdominal pain, nausea and vomiting.  Genitourinary: Negative.   Musculoskeletal: Negative.   Skin: Negative.   Allergic/Immunologic: Negative for immunocompromised state.  Neurological:  Positive for headaches (intermittent). Negative for dizziness, weakness and light-headedness.  Hematological:  Negative for adenopathy.  Psychiatric/Behavioral:  Negative for agitation and behavioral problems.       Objective:    BP (!) 167/114   Pulse 88   Temp 98.6 F (37 C) (Oral)   Wt 184 lb (83.5 kg)   SpO2 96%   BMI 28.82 kg/m  Nursing note and vital signs reviewed.  Physical Exam Vitals reviewed.  Constitutional:      General: He is not in acute distress.    Appearance: Normal appearance. He is not ill-appearing, toxic-appearing or diaphoretic.  HENT:     Head: Normocephalic and atraumatic.  Eyes:     Extraocular Movements: Extraocular movements intact.     Pupils: Pupils are equal, round, and reactive to light.  Cardiovascular:     Rate and Rhythm: Normal rate and regular rhythm.     Pulses: Normal pulses.     Heart sounds: No murmur heard.    No gallop.  Pulmonary:     Effort: Pulmonary effort is normal.     Breath sounds: Normal breath sounds.  Skin:    General: Skin is warm  and dry.  Neurological:     General: No focal deficit present.     Mental Status: He is alert and oriented to person, place, and time. Mental status is at baseline.         12/05/2022    9:54 AM 11/20/2021    2:04 PM 08/30/2018    2:44 PM 03/23/2017    4:05 PM 10/01/2016    2:37 PM  Depression screen PHQ 2/9  Decreased Interest 0 0 0 0 0  Down, Depressed, Hopeless 0 0 0 0 0  PHQ - 2 Score 0 0 0 0 0       Assessment & Plan:  HIV-1: 09/2021 VL 34, CD4 586, adherent to genvoya daily, tolerating well. Spoke with Alfonse Spruce Pharm_D regarding Cabanuva-will wait and  consider, GENOTYPE 2014 with out resistance. Refille dgenvoya, follow up in 3 months with Marya Amsler  STI screening-sexually active 3-4 months ago, will screen RPR, GC/C urine only per patient  HTN-uncontrolled, previously did not tolerate amlodipine, will start HCTZ 12.5 mg today, CMP, lipids today, A1c, will monitor at home keep a log and return to check up in 2 weeks, DASH diet and moderate exercise discussed.  Headaches for several weeks.   Health maintenance: declined vaccines today. Dental referral placed today. Discussed diet changes.    Patient Active Problem List   Diagnosis Date Noted   Abscess 01/25/2021   Laceration of left arm with complication XX123456   Healthcare maintenance 06/25/2020   Eczema 04/06/2019   Need for vaccination for bacterial disease 03/23/2017   Medication monitoring encounter 10/01/2016   Screening examination for venereal disease 09/27/2015   Encounter for long-term (current) use of medications 09/27/2015   Syphilis contact, treated 04/23/2015   High blood pressure 01/16/2014   Tattoos 07/29/2013   Human immunodeficiency virus (HIV) disease ([place]) 07/29/2013     Problem List Items Addressed This Visit       Cardiovascular and Mediastinum   High blood pressure   Relevant Medications   hydrochlorothiazide (MICROZIDE) 12.5 MG capsule     Other   Human immunodeficiency virus (HIV) disease ([place]) - Primary   Relevant Medications   elvitegravir-cobicistat-emtricitabine-tenofovir (GENVOYA) 150-150-200-10 MG TABS tablet   Other Visit Diagnoses     HIV disease ([place])       Relevant Medications   elvitegravir-cobicistat-emtricitabine-tenofovir (GENVOYA) 150-150-200-10 MG TABS tablet   Other Relevant Orders   CBC with Differential/Platelet   COMPLETE METABOLIC PANEL WITH GFR   HIV-1 RNA quant-no reflex-bld   Lipid panel   RPR   T-helper cells (CD4) count (not at [facility])   C. trachomatis/N. gonorrhoeae RNA   Hemoglobin A1c   Routine screening for STI  (sexually transmitted infection)            I am having Jasaun J. Halbleib start on hydrochlorothiazide. I am also having him maintain his Genvoya.   Meds ordered this encounter  Medications   elvitegravir-cobicistat-emtricitabine-tenofovir (GENVOYA) 150-150-200-10 MG TABS tablet    Sig: Take 1 tablet by mouth daily with breakfast.    Dispense:  30 tablet    Refill:  6    Order Specific Question:   Supervising Provider    Answer:   VAN DAM, CORNELIUS N [[id number]]   hydrochlorothiazide (MICROZIDE) 12.5 MG capsule    Sig: Take 1 capsule (12.5 mg total) by mouth daily.    Dispense:  30 capsule    Refill:  2    Order Specific Question:   Supervising Provider  Answer:   VAN DAM, CORNELIUS N [id number]     Follow-up: Return in about 3 months (around 03/07/2023) for Greg B20.

## 2022-12-05 NOTE — Patient Instructions (Addendum)
Condoms and lubricant provided Discussed Cabanuva with pharmacist [place] today Elevated blood pressure since 2019, most accurate reading will be at home when you have relaxed for 5 minutes, keep a log at home, cuffs can be purchased at drug store Follow up with greg in 3 months Start hydrochlorothiazide 12.5 mg once daily Continue genvoya daily Updated ADAP today Follow up in 2 week to recheck blood pressure

## 2022-12-08 LAB — CBC WITH DIFFERENTIAL/PLATELET
Absolute Monocytes: 429 cells/uL (ref 200–950)
Basophils Absolute: 28 cells/uL (ref 0–200)
Basophils Relative: 0.5 %
Eosinophils Absolute: 121 cells/uL (ref 15–500)
Eosinophils Relative: 2.2 %
HCT: 45.2 % (ref 38.5–50.0)
Hemoglobin: 15.8 g/dL (ref 13.2–17.1)
Lymphs Abs: 1914 cells/uL (ref 850–3900)
MCH: 30.7 pg (ref 27.0–33.0)
MCHC: 35 g/dL (ref 32.0–36.0)
MCV: 87.8 fL (ref 80.0–100.0)
MPV: 10.6 fL (ref 7.5–12.5)
Monocytes Relative: 7.8 %
Neutro Abs: 3009 cells/uL (ref 1500–7800)
Neutrophils Relative %: 54.7 %
Platelets: 268 10*3/uL (ref 140–400)
RBC: 5.15 10*6/uL (ref 4.20–5.80)
RDW: 13.2 % (ref 11.0–15.0)
Total Lymphocyte: 34.8 %
WBC: 5.5 10*3/uL (ref 3.8–10.8)

## 2022-12-08 LAB — COMPLETE METABOLIC PANEL WITH GFR
AG Ratio: 1.7 (calc) (ref 1.0–2.5)
ALT: 24 U/L (ref 9–46)
AST: 27 U/L (ref 10–40)
Albumin: 4.5 g/dL (ref 3.6–5.1)
Alkaline phosphatase (APISO): 53 U/L (ref 36–130)
BUN: 16 mg/dL (ref 7–25)
CO2: 27 mmol/L (ref 20–32)
Calcium: 9.6 mg/dL (ref 8.6–10.3)
Chloride: 105 mmol/L (ref 98–110)
Creat: 1.05 mg/dL (ref 0.60–1.26)
Globulin: 2.7 g/dL (calc) (ref 1.9–3.7)
Glucose, Bld: 92 mg/dL (ref 65–99)
Potassium: 4.2 mmol/L (ref 3.5–5.3)
Sodium: 140 mmol/L (ref 135–146)
Total Bilirubin: 0.6 mg/dL (ref 0.2–1.2)
Total Protein: 7.2 g/dL (ref 6.1–8.1)
eGFR: 97 mL/min/{1.73_m2} (ref >=60)

## 2022-12-08 LAB — C. TRACHOMATIS/N. GONORRHOEAE RNA
C. trachomatis RNA, TMA: NOT DETECTED
N. gonorrhoeae RNA, TMA: NOT DETECTED

## 2022-12-08 LAB — LIPID PANEL
Cholesterol: 220 mg/dL — ABNORMAL HIGH (ref <200)
HDL: 65 mg/dL (ref >=40)
LDL Cholesterol (Calc): 138 mg/dL (calc) — ABNORMAL HIGH
Non-HDL Cholesterol (Calc): 155 mg/dL (calc) — ABNORMAL HIGH (ref <130)
Total CHOL/HDL Ratio: 3.4 (calc) (ref <5.0)
Triglycerides: 80 mg/dL (ref <150)

## 2022-12-08 LAB — RPR: RPR Ser Ql: NONREACTIVE

## 2022-12-08 LAB — HEMOGLOBIN A1C
Hgb A1c MFr Bld: 5.6 % of total Hgb (ref <5.7)
Mean Plasma Glucose: 114 mg/dL
eAG (mmol/L): 6.3 mmol/L

## 2022-12-08 LAB — T-HELPER CELLS (CD4) COUNT (NOT AT ARMC)
Absolute CD4: 655 cells/uL (ref 490–1740)
CD4 T Helper %: 31 % (ref 30–61)
Total lymphocyte count: 2080 cells/uL (ref 850–3900)

## 2022-12-08 LAB — HIV-1 RNA QUANT-NO REFLEX-BLD
HIV 1 RNA Quant: <20 Copies/mL — ABNORMAL HIGH
HIV-1 RNA Quant, Log: <1.3 Log cps/mL — ABNORMAL HIGH

## 2022-12-22 ENCOUNTER — Ambulatory Visit: Payer: Self-pay

## 2022-12-22 ENCOUNTER — Telehealth: Payer: Self-pay

## 2022-12-22 NOTE — Telephone Encounter (Signed)
Called patient to see if he was going to make it to is nurse visit appointment this morning for BP check, no answer. Left HIPAA compliant voicemail requesting callback.   Beryle Flock, RN

## 2023-01-26 ENCOUNTER — Other Ambulatory Visit: Payer: Self-pay

## 2023-01-26 ENCOUNTER — Emergency Department (HOSPITAL_COMMUNITY)
Admission: EM | Admit: 2023-01-26 | Discharge: 2023-01-26 | Disposition: A | Discharge status: Home / Self Care | Payer: Self-pay | Attending: Emergency Medicine | Admitting: Emergency Medicine

## 2023-01-26 ENCOUNTER — Encounter (HOSPITAL_COMMUNITY): Payer: Self-pay

## 2023-01-26 ENCOUNTER — Emergency Department (HOSPITAL_COMMUNITY): Payer: Self-pay

## 2023-01-26 DIAGNOSIS — M546 Pain in thoracic spine: Secondary | ICD-10-CM | POA: Insufficient documentation

## 2023-01-26 DIAGNOSIS — M25512 Pain in left shoulder: Secondary | ICD-10-CM | POA: Insufficient documentation

## 2023-01-26 DIAGNOSIS — Y92410 Unspecified street and highway as the place of occurrence of the external cause: Secondary | ICD-10-CM | POA: Insufficient documentation

## 2023-01-26 DIAGNOSIS — Z21 Asymptomatic human immunodeficiency virus [HIV] infection status: Secondary | ICD-10-CM | POA: Insufficient documentation

## 2023-01-26 DIAGNOSIS — Z79899 Other long term (current) drug therapy: Secondary | ICD-10-CM | POA: Insufficient documentation

## 2023-01-26 MED ORDER — TIZANIDINE HCL 2 MG PO CAPS: 2.0000 mg | ORAL_CAPSULE | Freq: Three times a day (TID) | ORAL | 0 refills | Status: DC

## 2023-01-26 MED ORDER — DICLOFENAC SODIUM 1 % EX GEL: 2.0000 g | Freq: Four times a day (QID) | CUTANEOUS | 0 refills | Status: DC
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
## 2023-02-09 ENCOUNTER — Telehealth: Payer: Self-pay

## 2023-02-09 MED ORDER — CLOBETASOL PROPIONATE 0.05 % EX CREA: TOPICAL_CREAM | CUTANEOUS | 0 refills | Status: DC

## 2023-02-09 NOTE — Telephone Encounter (Signed)
Patient called, says he was cutting grass a week ago and thinks he got poison ivy. The rash has gotten worse as he hasn't been able to stop scratching, he is requesting recommendations or that prescription be sent to [facility] on [address].   [name], RN

## 2023-02-09 NOTE — Telephone Encounter (Signed)
Spoke with Aasim, notified him that clobetasol has been sent in, but that it may not be covered by [employer]. He is familiar with [facility] and will check for a coupon there.   [name], RN

## 2023-02-09 NOTE — Telephone Encounter (Signed)
Patrick Dunlap reports that he has the rash on both of his upper arms and around his collarbone area.   [name], RN

## 2023-02-09 NOTE — Addendum Note (Signed)
Addended by: Jeanine Luz D on: 02/09/2023 12:56 PM   Modules accepted: Orders

## 2023-02-09 NOTE — Telephone Encounter (Signed)
Clobetasol cream sent to pharmacy. I do not think it is covered by [employer] and may need [facility] card.

## 2023-07-27 ENCOUNTER — Ambulatory Visit: Payer: Medicaid Other

## 2023-07-27 ENCOUNTER — Ambulatory Visit: Payer: Medicaid Other | Admitting: Family

## 2023-07-27 NOTE — Progress Notes (Deleted)
    Brief Narrative   Patient ID: Patrick Dunlap, male    DOB: 09-15-1991, 32 y.o.   MRN: [id number]    Subjective:    No chief complaint on file.   HPI:  Patrick Dunlap is a 32 y.o. male with HIV disease last seen on     Denies fevers, chills, night sweats, headaches, changes in vision, neck pain/stiffness, nausea, diarrhea, vomiting, lesions or rashes.  Lab Results  Component Value Date   CD4TCELL 31 12/05/2022   CD4TABS 496 06/25/2020   Lab Results  Component Value Date   HIV1RNAQUANT <20 (H) 12/05/2022     Allergies  Allergen Reactions   Penicillins Rash    Has had pcn since then      Outpatient Medications Prior to Visit  Medication Sig Dispense Refill   clobetasol cream (TEMOVATE) 0.05 % Apply to affected area 2 times daily. 30 g 0   diclofenac Sodium (VOLTAREN) 1 % GEL Apply 2 g topically 4 (four) times daily. 50 g 0   elvitegravir-cobicistat-emtricitabine-tenofovir (GENVOYA) 150-150-200-10 MG TABS tablet Take 1 tablet by mouth daily with breakfast. 30 tablet 6   hydrochlorothiazide (MICROZIDE) 12.5 MG capsule Take 1 capsule (12.5 mg total) by mouth daily. 30 capsule 2   tizanidine (ZANAFLEX) 2 MG capsule Take 1 capsule (2 mg total) by mouth 3 (three) times daily. 15 capsule 0   No facility-administered medications prior to visit.     Past Medical History:  Diagnosis Date   Abscess 01/25/2021   HIV infection (HCC)    Laceration of left arm with complication 01/25/2021     No past surgical history on file.    Review of Systems    Objective:    There were no vitals taken for this visit. Nursing note and vital signs reviewed.  Physical Exam      12/05/2022    9:54 AM 11/20/2021    2:04 PM 08/30/2018    2:44 PM 03/23/2017    4:05 PM 10/01/2016    2:37 PM  Depression screen PHQ 2/9  Decreased Interest 0 0 0 0 0  Down, Depressed, Hopeless 0 0 0 0 0  PHQ - 2 Score 0 0 0 0 0       Assessment & Plan:    Patient Active Problem  List   Diagnosis Date Noted   Abscess 01/25/2021   Laceration of left arm with complication 01/25/2021   Healthcare maintenance 06/25/2020   Eczema 04/06/2019   Need for vaccination for bacterial disease 03/23/2017   Medication monitoring encounter 10/01/2016   Screening examination for venereal disease 09/27/2015   Encounter for long-term (current) use of medications 09/27/2015   Syphilis contact, treated 04/23/2015   High blood pressure 01/16/2014   Tattoos 07/29/2013   Human immunodeficiency virus (HIV) disease (HCC) 07/29/2013     Problem List Items Addressed This Visit   None    I am having Jarrell J. Supinski maintain his Genvoya, hydrochlorothiazide, diclofenac Sodium, tizanidine, and clobetasol cream.   No orders of the defined types were placed in this encounter.    Follow-up: No follow-ups on file. or sooner if needed.    Marcos Eke, MSN, FNP-C Nurse Practitioner [facility] for Infectious Disease [facility] Main number: [phone]

## 2023-10-28 ENCOUNTER — Ambulatory Visit: Payer: Medicaid Other | Admitting: Physician Assistant

## 2023-11-02 ENCOUNTER — Other Ambulatory Visit: Payer: Self-pay

## 2023-11-02 ENCOUNTER — Ambulatory Visit (INDEPENDENT_AMBULATORY_CARE_PROVIDER_SITE_OTHER): Payer: Medicaid Other | Admitting: Family Medicine

## 2023-11-02 ENCOUNTER — Encounter: Payer: Self-pay | Admitting: Family Medicine

## 2023-11-02 VITALS — BP 175/123 | HR 69 | Ht 67.0 in | Wt 184.2 lb

## 2023-11-02 DIAGNOSIS — Z0000 Encounter for general adult medical examination without abnormal findings: Secondary | ICD-10-CM | POA: Diagnosis not present

## 2023-11-02 DIAGNOSIS — I10 Essential (primary) hypertension: Secondary | ICD-10-CM | POA: Diagnosis not present

## 2023-11-02 DIAGNOSIS — L309 Dermatitis, unspecified: Secondary | ICD-10-CM | POA: Diagnosis not present

## 2023-11-02 DIAGNOSIS — B20 Human immunodeficiency virus [HIV] disease: Secondary | ICD-10-CM | POA: Diagnosis not present

## 2023-11-02 DIAGNOSIS — Z23 Encounter for immunization: Secondary | ICD-10-CM | POA: Diagnosis not present

## 2023-11-02 DIAGNOSIS — Z113 Encounter for screening for infections with a predominantly sexual mode of transmission: Secondary | ICD-10-CM

## 2023-11-02 DIAGNOSIS — Z79899 Other long term (current) drug therapy: Secondary | ICD-10-CM

## 2023-11-02 MED ORDER — AMLODIPINE BESYLATE 5 MG PO TABS: 5.0000 mg | ORAL_TABLET | Freq: Every day | ORAL | 1 refills | Status: DC

## 2023-11-02 MED ORDER — HYDROCORTISONE 2.5 % EX OINT: TOPICAL_OINTMENT | Freq: Two times a day (BID) | CUTANEOUS | 0 refills | Status: DC | PRN

## 2023-11-02 NOTE — Patient Instructions (Addendum)
 It was nice to meet you today!  Refilled eczema ointment - sent in hydrocortisone  (stronger than over the counter) Start amlodipine  5mg  daily You would qualify for an additional dose of pneumonia vaccine but we are out of it today  Schedule follow up with me in 1 month, sooner if needed  Be well, Dr. Dawn Eth

## 2023-11-02 NOTE — Progress Notes (Signed)
  Date of Visit: 11/02/2023   SUBJECTIVE:   HPI:  Patrick Dunlap presents today for a new patient appointment to establish general primary care. His family member Patrick Dunlap is also my patient.   Prior PCP: none  Other care team members: Dr. Luciana Axe of [facility] manages HIV medications   Concerns today: refill of eczema cream, hypertension   Past Medical Hx:  -hypertension - has been high in past, previously took ?amlodipine but felt lightheaded with that -HIV - on Genvoya with excellent response, sees [facility] Dr. Luciana Axe -eczema - uses topical steroid as needed   Past Surgical Hx:  -none  Social Hx:  - occupation: Manufacturing engineer - highest level of education: some college - tobacco: quit; previously smoked for a few years (1-2 cigs/day) - alcohol: none - drugs: none - denies current sexual activity  Health Maintenance:  -desires flu vaccine today   OBJECTIVE:   BP (!) 162/127   Pulse 69   Ht 5\' 7"  (1.702 m)   Wt 184 lb 3.2 oz (83.6 kg)   SpO2 99%   BMI 28.85 kg/m  Gen: no acute distress, pleasant, cooperative HEENT: normocephalic, atraumatic  Heart: regular rate and rhythm, no murmur Lungs: clear to auscultation bilaterally, normal work of breathing  Neuro: alert, grossly nonfocal, speech normal Psych: normal range of affect, well groomed, speech normal in rate and volume, normal eye contact   ASSESSMENT/PLAN:   Assessment & Plan Primary hypertension Blood pressure significantly elevated today Trial of amlodipine 5mg  daily, he is amenable to retrying this to avoid all the medication monitoring associated with other antihypertensives Follow up in 1 month to recheck Eczema, unspecified type Stable on topical steroid Refill - hydrocortisone 2.5% (topical triamcinolone apparently has interaction with his Genvoya) Human immunodeficiency virus (HIV) disease (HCC) Stable, excellent compliance with medication, continue follow up with [facility]  maintenance Flu shot today Advised he would qualify for PCV 20 or PCV21 but we are out of it today He can get at [facility] or hopefully we will have it in stock next visit    FOLLOW UP: Follow up in 1 mo for hypertension   [place] J. Pollie Meyer, MD, [facility]

## 2023-11-03 ENCOUNTER — Other Ambulatory Visit (HOSPITAL_COMMUNITY): Payer: Self-pay

## 2023-11-03 ENCOUNTER — Other Ambulatory Visit: Payer: Medicaid Other

## 2023-11-03 ENCOUNTER — Ambulatory Visit: Payer: Medicaid Other

## 2023-11-04 NOTE — Assessment & Plan Note (Signed)
Stable on topical steroid Refill - hydrocortisone 2.5% (topical triamcinolone apparently has interaction with his Genvoya)

## 2023-11-04 NOTE — Assessment & Plan Note (Signed)
Stable, excellent compliance with medication, continue follow up with [facility]

## 2023-11-04 NOTE — Assessment & Plan Note (Signed)
Blood pressure significantly elevated today Trial of amlodipine 5mg  daily, he is amenable to retrying this to avoid all the medication monitoring associated with other antihypertensives Follow up in 1 month to recheck

## 2023-11-04 NOTE — Assessment & Plan Note (Signed)
Flu shot today Advised he would qualify for PCV 20 or PCV21 but we are out of it today He can get at [facility] or hopefully we will have it in stock next visit

## 2023-11-06 ENCOUNTER — Other Ambulatory Visit: Payer: Self-pay

## 2023-11-06 DIAGNOSIS — B20 Human immunodeficiency virus [HIV] disease: Secondary | ICD-10-CM

## 2023-11-06 MED ORDER — GENVOYA 150-150-200-10 MG PO TABS: 1.0000 | ORAL_TABLET | Freq: Every day | ORAL | 0 refills | Status: DC

## 2023-11-07 DIAGNOSIS — H5213 Myopia, bilateral: Secondary | ICD-10-CM | POA: Diagnosis not present

## 2023-11-17 ENCOUNTER — Ambulatory Visit: Payer: Medicaid Other | Admitting: Family

## 2023-11-21 DIAGNOSIS — R369 Urethral discharge, unspecified: Secondary | ICD-10-CM | POA: Diagnosis not present

## 2023-11-24 ENCOUNTER — Other Ambulatory Visit: Payer: Medicaid Other

## 2023-11-30 ENCOUNTER — Ambulatory Visit: Payer: Medicaid Other | Admitting: Family Medicine

## 2023-12-08 ENCOUNTER — Ambulatory Visit: Payer: Medicaid Other | Admitting: Family

## 2023-12-22 ENCOUNTER — Ambulatory Visit (INDEPENDENT_AMBULATORY_CARE_PROVIDER_SITE_OTHER): Admitting: Student

## 2023-12-22 VITALS — BP 151/110 | HR 93 | Ht 67.0 in | Wt 180.2 lb

## 2023-12-22 DIAGNOSIS — T7840XA Allergy, unspecified, initial encounter: Secondary | ICD-10-CM

## 2023-12-22 DIAGNOSIS — I10 Essential (primary) hypertension: Secondary | ICD-10-CM | POA: Diagnosis not present

## 2023-12-22 MED ORDER — OLOPATADINE HCL 0.2 % OP SOLN: OPHTHALMIC | 0 refills | Status: DC

## 2023-12-22 MED ORDER — AMLODIPINE BESYLATE-VALSARTAN 5-160 MG PO TABS
1.0000 | ORAL_TABLET | Freq: Every day | ORAL | 0 refills | Status: DC
Start: 2023-12-22 — End: 2024-02-16

## 2023-12-22 MED ORDER — CETIRIZINE HCL 10 MG PO TABS
10.0000 mg | ORAL_TABLET | Freq: Every day | ORAL | 0 refills | Status: DC
Start: 2023-12-22 — End: 2024-04-19

## 2023-12-22 MED ORDER — AZELASTINE HCL 0.1 % NA SOLN: 2.0000 | Freq: Two times a day (BID) | NASAL | 12 refills | Status: AC

## 2023-12-22 NOTE — Progress Notes (Signed)
    SUBJECTIVE:   CHIEF COMPLAINT / HPI:   Allergies:  Mr. Ned, a patient with a history of hypertension, presents with worsening seasonal allergies over the past three weeks. His primary complaint is nasal congestion, which has been severe enough to cause mouth breathing. He has been using over-the-counter nasal sprays from [facility] for symptom relief. He denies having asthma or sleep apnea. 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 He has not previously sought medical attention for his allergies. He also reports a history of eczema.  PERTINENT  PMH / PSH:  HTN HIV   OBJECTIVE:   BP (!) 151/110   Pulse 93   Ht 5\' 7"  (1.702 m)   Wt 180 lb 4 oz (81.8 kg)   SpO2 97%   BMI 28.23 kg/m   General: Alert and oriented in no apparent distress Head: [place]/AT.   Eyes:  EOMI, PERRL.   Ears:  External ears WNL Nose:  Septum midline  Mouth:  MMM, tonsils non-erythematous, non-edematous.   Heart: Regular rate and rhythm with no murmurs appreciated Lungs: normal wob  Abdomen: no abdominal pain Skin: Warm and dry   ASSESSMENT/PLAN:   Assessment & Plan Allergy, initial encounter Allergic Rhinitis Chronic nasal congestion and itchy eyes likely due to seasonal allergies exacerbated by occupational exposure to grass. Symptoms have persisted for approximately three weeks. No history of asthma or prior allergy treatment. Over-the-counter nasal sprays have been used excessively without relief. Considering occupational factors and symptom severity, a combination of oral antihistamines, nasal spray, and eye drops is recommended. - Prescribe Zyrtec to take daily. - Prescribe azelastine nasal spray, two sprays in each nostril twice a day. - Prescribe Pataday eye drops, one drop in each eye daily as needed for itchiness. - Advise to monitor for any shortness of breath and report if it occurs.  Primary hypertension Elevated blood pressure during the visit. Currently on  amlodipine 5 mg but missed today's dose. Considering HIV treatment with Genvoya, potential drug interactions were reviewed. Decision made to switch to a combination therapy to better manage blood pressure. Exforge, which combines amlodipine and valsartan, was chosen to avoid potential interactions and improve blood pressure control. BMP UTD.  - Discontinue current amlodipine monotherapy. - Prescribe Exforge (amlodipine and valsartan combination) to improve blood pressure control. - Schedule follow-up appointment in three weeks to monitor blood pressure and conduct lab tests.  HIV On Genvoya for HIV management. No specific issues or changes discussed during this visit.     Alfredo Martinez, MD [facility]

## 2023-12-22 NOTE — Assessment & Plan Note (Signed)
 Elevated blood pressure during the visit. Currently on amlodipine 5 mg but missed today's dose. Considering HIV treatment with Genvoya, potential drug interactions were reviewed. Decision made to switch to a combination therapy to better manage blood pressure. Exforge, which combines amlodipine and valsartan, was chosen to avoid potential interactions and improve blood pressure control. BMP UTD.  - Discontinue current amlodipine monotherapy. - Prescribe Exforge (amlodipine and valsartan combination) to improve blood pressure control. - Schedule follow-up appointment in three weeks to monitor blood pressure and conduct lab tests.  HIV On Genvoya for HIV management. No specific issues or changes discussed during this visit.

## 2023-12-22 NOTE — Patient Instructions (Addendum)
 It was great to see you today! Thank you for choosing [facility] for your primary care.  Today we addressed: Azelastine nasal spray two sprays in nose twice daily  Zyrtec daily  Pataday drops as needed - 1 drop in affected eye(s) once daily Exforge daily stop amlodipine  Return in 3 weeks    If you haven't already, sign up for My Chart to have easy access to your labs results, and communication with your primary care physician.   Please arrive 15 minutes before your appointment to ensure smooth check in process.  We appreciate your efforts in making this happen.  Thank you for allowing me to participate in your care, Alfredo Martinez, MD 12/22/2023, 9:40 AM PGY-3, [facility]

## 2023-12-22 NOTE — Assessment & Plan Note (Signed)
 Allergic Rhinitis Chronic nasal congestion and itchy eyes likely due to seasonal allergies exacerbated by occupational exposure to grass. Symptoms have persisted for approximately three weeks. No history of asthma or prior allergy treatment. Over-the-counter nasal sprays have been used excessively without relief. Considering occupational factors and symptom severity, a combination of oral antihistamines, nasal spray, and eye drops is recommended. - Prescribe Zyrtec to take daily. - Prescribe azelastine nasal spray, two sprays in each nostril twice a day. - Prescribe Pataday eye drops, one drop in each eye daily as needed for itchiness. - Advise to monitor for any shortness of breath and report if it occurs.

## 2024-01-15 ENCOUNTER — Ambulatory Visit: Admitting: Student

## 2024-01-15 NOTE — Progress Notes (Deleted)
    SUBJECTIVE:   CHIEF COMPLAINT / HPI:   HTN - Started on Exforge  for BP control    PERTINENT  PMH / PSH:  HTN HIV  OBJECTIVE:   There were no vitals taken for this visit.  ***  ASSESSMENT/PLAN:   Assessment & Plan      Ernestina Headland, MD [facility]

## 2024-01-26 NOTE — Progress Notes (Unsigned)
 HPI: Patrick Dunlap is a 33 y.o. male who presents to the [facility] pharmacy clinic for HIV follow-up.  Patient Active Problem List   Diagnosis Date Noted   Allergies 12/22/2023   Healthcare maintenance 06/25/2020   Eczema 04/06/2019   Need for vaccination for bacterial disease 03/23/2017   Medication monitoring encounter 10/01/2016   Screening examination for venereal disease 09/27/2015   Encounter for long-term (current) use of medications 09/27/2015   Syphilis contact, treated 04/23/2015   Hypertension 01/16/2014   Tattoos 07/29/2013   Human immunodeficiency virus (HIV) disease (HCC) 07/29/2013    Patient's Medications  New Prescriptions   No medications on file  Previous Medications   AMLODIPINE -VALSARTAN  (EXFORGE ) 5-160 MG TABLET    Take 1 tablet by mouth daily.   AZELASTINE  (ASTELIN ) 0.1 % NASAL SPRAY    Place 2 sprays into both nostrils 2 (two) times daily. Use in each nostril as directed   CETIRIZINE  (ZYRTEC  ALLERGY) 10 MG TABLET    Take 1 tablet (10 mg total) by mouth daily.   ELVITEGRAVIR-COBICISTAT-EMTRICITABINE-TENOFOVIR (GENVOYA ) 150-150-200-10 MG TABS TABLET    Take 1 tablet by mouth daily with breakfast.   HYDROCORTISONE  2.5 % OINTMENT    Apply topically 2 (two) times daily as needed.   OLOPATADINE  HCL 0.2 % SOLN    1 drop in affected eye(s) once daily  Modified Medications   No medications on file  Discontinued Medications   No medications on file    Labs: Lab Results  Component Value Date   HIV1RNAQUANT <20 (H) 12/05/2022   HIV1RNAQUANT 34 (H) 10/11/2021   HIV1RNAQUANT <20 (H) 01/25/2021   CD4TABS 496 06/25/2020   CD4TABS 583 11/28/2019   CD4TABS 662 03/08/2019    RPR and STI Lab Results  Component Value Date   LABRPR NON-REACTIVE 12/05/2022   LABRPR NON-REACTIVE 10/11/2021   LABRPR NON-REACTIVE 01/25/2021   LABRPR REACTIVE (A) 06/25/2020   LABRPR NON-REACTIVE 11/28/2019   RPRTITER 1:1 (H) 06/25/2020   RPRTITER 1:1 (H) 11/30/2017    RPRTITER 1:2 11/30/2014   RPRTITER 1:4 08/04/2014   RPRTITER 1:16 (A) 07/26/2013    STI Results GC CT  10/11/2021 10:10 AM Negative  Negative   01/25/2021 11:02 AM Negative  Negative   06/25/2020 11:09 AM Negative  Negative   11/28/2019 12:00 AM Negative  Negative   03/08/2019 12:00 AM Negative  Negative   08/30/2018 12:00 AM Negative    Negative  Negative    Negative   11/30/2017 12:00 AM Negative  Negative   09/27/2015 12:00 AM Negative    Negative*  Negative    Negative*   11/30/2014 12:00 AM NG: Negative  CT: Negative   08/04/2014 12:00 AM **NG: POSITIVE**  CT: Negative     Hepatitis B Lab Results  Component Value Date   HEPBSAB NEG 07/26/2013   HEPBSAG NEGATIVE 07/26/2013   HEPBCAB NON REACTIVE 07/26/2013   Hepatitis C No results found for: "HEPCAB", "HCVRNAPCRQN" Hepatitis A Lab Results  Component Value Date   HAV REACTIVE (A) 07/26/2013   Lipids: Lab Results  Component Value Date   CHOL 220 (H) 12/05/2022   TRIG 80 12/05/2022   HDL 65 12/05/2022   CHOLHDL 3.4 12/05/2022   VLDL 9 10/01/2016   LDLCALC 138 (H) 12/05/2022    Current HIV Regimen: Taking Genvoya  daily. No recent office visits, patient has since run out of supply. last dose of Genvoya  on ***.   Assessment: ***  Plan: ***   Keneth Pay, PharmD PGY1 Pharmacy  Resident 01/26/2024, 10:56 PM

## 2024-01-27 ENCOUNTER — Other Ambulatory Visit: Payer: Self-pay

## 2024-01-27 ENCOUNTER — Telehealth: Payer: Self-pay

## 2024-01-27 ENCOUNTER — Ambulatory Visit: Admitting: Pharmacist

## 2024-01-27 ENCOUNTER — Other Ambulatory Visit (HOSPITAL_COMMUNITY): Payer: Self-pay

## 2024-01-27 ENCOUNTER — Other Ambulatory Visit (HOSPITAL_COMMUNITY)
Admission: RE | Admit: 2024-01-27 | Discharge: 2024-01-27 | Disposition: A | Discharge status: Home / Self Care | Source: Ambulatory Visit | Attending: Family | Admitting: Family

## 2024-01-27 DIAGNOSIS — Z79899 Other long term (current) drug therapy: Secondary | ICD-10-CM | POA: Diagnosis not present

## 2024-01-27 DIAGNOSIS — Z0000 Encounter for general adult medical examination without abnormal findings: Secondary | ICD-10-CM | POA: Diagnosis not present

## 2024-01-27 DIAGNOSIS — Z113 Encounter for screening for infections with a predominantly sexual mode of transmission: Secondary | ICD-10-CM | POA: Diagnosis not present

## 2024-01-27 DIAGNOSIS — B20 Human immunodeficiency virus [HIV] disease: Secondary | ICD-10-CM | POA: Diagnosis not present

## 2024-01-27 DIAGNOSIS — Z23 Encounter for immunization: Secondary | ICD-10-CM | POA: Diagnosis not present

## 2024-01-27 DIAGNOSIS — Z202 Contact with and (suspected) exposure to infections with a predominantly sexual mode of transmission: Secondary | ICD-10-CM | POA: Diagnosis present

## 2024-01-27 MED ORDER — BIKTARVY 50-200-25 MG PO TABS
1.0000 | ORAL_TABLET | Freq: Every day | ORAL | 2 refills | Status: DC
Start: 2024-01-27 — End: 2024-02-29

## 2024-01-27 MED ORDER — CEFTRIAXONE SODIUM 500 MG IJ SOLR
500.0000 mg | Freq: Once | INTRAMUSCULAR | Status: AC
  Administered 2024-01-27: 500 mg via INTRAMUSCULAR

## 2024-01-27 NOTE — Telephone Encounter (Signed)
 Pharmacy Patient Advocate Encounter  Insurance verification completed.   The patient is insured through [facility]   Ran test claim for Biktarvy . Currently a quantity of 30 is a 30 day supply and the co-pay is $0.00 . Cabenuva $0.00  This test claim was processed through [facility]- copay amounts may vary at other pharmacies due to pharmacy/plan contracts, or as the patient moves through the different stages of their insurance plan.

## 2024-01-28 LAB — URINE CYTOLOGY ANCILLARY ONLY
Chlamydia: NEGATIVE
Comment: NEGATIVE
Comment: NORMAL
Neisseria Gonorrhea: NEGATIVE

## 2024-01-28 LAB — CYTOLOGY, (ORAL, ANAL, URETHRAL) ANCILLARY ONLY
Chlamydia: NEGATIVE
Comment: NEGATIVE
Comment: NORMAL
Neisseria Gonorrhea: POSITIVE — AB

## 2024-01-28 LAB — T-HELPER CELLS (CD4) COUNT (NOT AT ARMC)
CD4 % Helper T Cell: 37 % (ref 33–65)
CD4 T Cell Abs: 717 /uL (ref 400–1790)

## 2024-01-30 LAB — COMPREHENSIVE METABOLIC PANEL WITH GFR
AG Ratio: 1.7 (calc) (ref 1.0–2.5)
ALT: 21 U/L (ref 9–46)
AST: 22 U/L (ref 10–40)
Albumin: 4.7 g/dL (ref 3.6–5.1)
Alkaline phosphatase (APISO): 60 U/L (ref 36–130)
BUN: 14 mg/dL (ref 7–25)
CO2: 30 mmol/L (ref 20–32)
Calcium: 10 mg/dL (ref 8.6–10.3)
Chloride: 104 mmol/L (ref 98–110)
Creat: 1.04 mg/dL (ref 0.60–1.26)
Globulin: 2.8 g/dL (ref 1.9–3.7)
Glucose, Bld: 99 mg/dL (ref 65–99)
Potassium: 4 mmol/L (ref 3.5–5.3)
Sodium: 140 mmol/L (ref 135–146)
Total Bilirubin: 0.3 mg/dL (ref 0.2–1.2)
Total Protein: 7.5 g/dL (ref 6.1–8.1)
eGFR: 97 mL/min/{1.73_m2} (ref >=60)

## 2024-01-30 LAB — CBC WITH DIFFERENTIAL/PLATELET
Absolute Lymphocytes: 2175 {cells}/uL (ref 850–3900)
Absolute Monocytes: 621 {cells}/uL (ref 200–950)
Basophils Absolute: 29 {cells}/uL (ref 0–200)
Basophils Relative: 0.4 %
Eosinophils Absolute: 197 {cells}/uL (ref 15–500)
Eosinophils Relative: 2.7 %
HCT: 46 % (ref 38.5–50.0)
Hemoglobin: 15.5 g/dL (ref 13.2–17.1)
MCH: 29.8 pg (ref 27.0–33.0)
MCHC: 33.7 g/dL (ref 32.0–36.0)
MCV: 88.5 fL (ref 80.0–100.0)
MPV: 10.6 fL (ref 7.5–12.5)
Monocytes Relative: 8.5 %
Neutro Abs: 4278 {cells}/uL (ref 1500–7800)
Neutrophils Relative %: 58.6 %
Platelets: 273 10*3/uL (ref 140–400)
RBC: 5.2 10*6/uL (ref 4.20–5.80)
RDW: 12.7 % (ref 11.0–15.0)
Total Lymphocyte: 29.8 %
WBC: 7.3 10*3/uL (ref 3.8–10.8)

## 2024-01-30 LAB — LIPID PANEL
Cholesterol: 218 mg/dL — ABNORMAL HIGH (ref <200)
HDL: 52 mg/dL (ref >=40)
LDL Cholesterol (Calc): 142 mg/dL — ABNORMAL HIGH
Non-HDL Cholesterol (Calc): 166 mg/dL — ABNORMAL HIGH (ref <130)
Total CHOL/HDL Ratio: 4.2 (calc) (ref <5.0)
Triglycerides: 119 mg/dL (ref <150)

## 2024-01-30 LAB — RPR: RPR Ser Ql: NONREACTIVE

## 2024-01-30 LAB — HEPATITIS B SURFACE ANTIBODY,QUALITATIVE: Hep B S Ab: REACTIVE — AB

## 2024-01-30 LAB — HIV RNA, RTPCR W/R GT (RTI, PI,INT)
HIV 1 RNA Quant: <20 {copies}/mL — AB
HIV-1 RNA Quant, Log: <1.3 {Log_copies}/mL — AB

## 2024-02-16 ENCOUNTER — Other Ambulatory Visit: Payer: Self-pay

## 2024-02-16 ENCOUNTER — Other Ambulatory Visit: Payer: Self-pay | Admitting: Family Medicine

## 2024-02-16 MED ORDER — AMLODIPINE BESYLATE-VALSARTAN 5-160 MG PO TABS: 1.0000 | ORAL_TABLET | Freq: Every day | ORAL | 0 refills | Status: DC

## 2024-02-16 NOTE — Telephone Encounter (Signed)
Please let patient know I am refilling this medication, but he needs to schedule an appointment with me.   Thanks, Jakorian Marengo J Jodiann Ognibene, MD  

## 2024-02-28 NOTE — Progress Notes (Unsigned)
 HPI: Patrick Dunlap is a 33 y.o. male who presents to the [facility] pharmacy clinic for HIV follow-up.  Patient Active Problem List   Diagnosis Date Noted   Allergies 12/22/2023   Healthcare maintenance 06/25/2020   Eczema 04/06/2019   Need for vaccination for bacterial disease 03/23/2017   Medication monitoring encounter 10/01/2016   Screening examination for venereal disease 09/27/2015   Encounter for long-term (current) use of medications 09/27/2015   Syphilis contact, treated 04/23/2015   Hypertension 01/16/2014   Tattoos 07/29/2013   Human immunodeficiency virus (HIV) disease (HCC) 07/29/2013    Patient's Medications  New Prescriptions   No medications on file  Previous Medications   AMLODIPINE -VALSARTAN  (EXFORGE ) 5-160 MG TABLET    Take 1 tablet by mouth daily.   AZELASTINE  (ASTELIN ) 0.1 % NASAL SPRAY    Place 2 sprays into both nostrils 2 (two) times daily. Use in each nostril as directed   BICTEGRAVIR-EMTRICITABINE-TENOFOVIR AF (BIKTARVY ) 50-200-25 MG TABS TABLET    Take 1 tablet by mouth daily.   CETIRIZINE  (ZYRTEC  ALLERGY) 10 MG TABLET    Take 1 tablet (10 mg total) by mouth daily.   HYDROCORTISONE  2.5 % OINTMENT    Apply topically 2 (two) times daily as needed.   OLOPATADINE  HCL 0.2 % SOLN    1 drop in affected eye(s) once daily  Modified Medications   No medications on file  Discontinued Medications   No medications on file    Labs: Lab Results  Component Value Date   HIV1RNAQUANT <20 DETECTED (A) 01/27/2024   HIV1RNAQUANT <20 (H) 12/05/2022   HIV1RNAQUANT 34 (H) 10/11/2021   CD4TABS 717 01/27/2024   CD4TABS 496 06/25/2020   CD4TABS 583 11/28/2019    RPR and STI Lab Results  Component Value Date   LABRPR NON-REACTIVE 01/27/2024   LABRPR NON-REACTIVE 12/05/2022   LABRPR NON-REACTIVE 10/11/2021   LABRPR NON-REACTIVE 01/25/2021   LABRPR REACTIVE (A) 06/25/2020   RPRTITER 1:1 (H) 06/25/2020   RPRTITER 1:1 (H) 11/30/2017   RPRTITER 1:2 11/30/2014    RPRTITER 1:4 08/04/2014   RPRTITER 1:16 (A) 07/26/2013    STI Results GC CT  01/27/2024 11:11 AM Positive    Negative  Negative    Negative   10/11/2021 10:10 AM Negative  Negative   01/25/2021 11:02 AM Negative  Negative   06/25/2020 11:09 AM Negative  Negative   11/28/2019 12:00 AM Negative  Negative   03/08/2019 12:00 AM Negative  Negative   08/30/2018 12:00 AM Negative    Negative  Negative    Negative   11/30/2017 12:00 AM Negative  Negative   09/27/2015 12:00 AM Negative    Negative*  Negative    Negative*   11/30/2014 12:00 AM NG: Negative  CT: Negative   08/04/2014 12:00 AM **NG: POSITIVE**  CT: Negative     Hepatitis B Lab Results  Component Value Date   HEPBSAB REACTIVE (A) 01/27/2024   HEPBSAG NEGATIVE 07/26/2013   HEPBCAB NON REACTIVE 07/26/2013   Hepatitis C No results found for: "HEPCAB", "HCVRNAPCRQN" Hepatitis A Lab Results  Component Value Date   HAV REACTIVE (A) 07/26/2013   Lipids: Lab Results  Component Value Date   CHOL 218 (H) 01/27/2024   TRIG 119 01/27/2024   HDL 52 01/27/2024   CHOLHDL 4.2 01/27/2024   VLDL 9 10/01/2016   LDLCALC 142 (H) 01/27/2024    Current HIV Regimen: Genvoya  1 tablet by mouth daily with breakfast -->Biktarvy  1 tablet by mouth once daily  Assessment: Patrick Dunlap  states he has remained well. He denied any issues with Biktarvy . He also did not report any missed doses. Review of fill history confirmed medication adherence.   He is eligible for HPV vaccine, he agreed to start this vaccine series today. Will complete oral swab to check for gonorrhea cure  Plan: -HIV RNA -STI screening (oral, urine cytologies) -Continue Biktarvy   -HPV vaccine 1/3 given today -Follow up in 3 months   Patrick Dunlap, PharmD [employer] PGY-1

## 2024-02-29 ENCOUNTER — Ambulatory Visit (INDEPENDENT_AMBULATORY_CARE_PROVIDER_SITE_OTHER): Admitting: Pharmacist

## 2024-02-29 ENCOUNTER — Other Ambulatory Visit: Payer: Self-pay

## 2024-02-29 ENCOUNTER — Encounter: Payer: Self-pay | Admitting: Family Medicine

## 2024-02-29 ENCOUNTER — Ambulatory Visit: Admitting: Family Medicine

## 2024-02-29 VITALS — BP 141/106 | HR 67 | Ht 67.0 in | Wt 184.0 lb

## 2024-02-29 DIAGNOSIS — L309 Dermatitis, unspecified: Secondary | ICD-10-CM | POA: Diagnosis not present

## 2024-02-29 DIAGNOSIS — B20 Human immunodeficiency virus [HIV] disease: Secondary | ICD-10-CM | POA: Diagnosis not present

## 2024-02-29 DIAGNOSIS — Z113 Encounter for screening for infections with a predominantly sexual mode of transmission: Secondary | ICD-10-CM | POA: Diagnosis not present

## 2024-02-29 DIAGNOSIS — I10 Essential (primary) hypertension: Secondary | ICD-10-CM | POA: Diagnosis not present

## 2024-02-29 DIAGNOSIS — Z23 Encounter for immunization: Secondary | ICD-10-CM

## 2024-02-29 MED ORDER — BIKTARVY 50-200-25 MG PO TABS: 1.0000 | ORAL_TABLET | Freq: Every day | ORAL | 5 refills | Status: AC

## 2024-02-29 MED ORDER — AMLODIPINE BESYLATE-VALSARTAN 10-160 MG PO TABS: 1.0000 | ORAL_TABLET | Freq: Every day | ORAL | 1 refills | Status: DC

## 2024-02-29 MED ORDER — HYDROCORTISONE 2.5 % EX CREA: TOPICAL_CREAM | Freq: Two times a day (BID) | CUTANEOUS | 0 refills | Status: AC | PRN

## 2024-02-29 MED ORDER — OLOPATADINE HCL 0.2 % OP SOLN: OPHTHALMIC | 0 refills | Status: AC

## 2024-02-29 NOTE — Progress Notes (Unsigned)
   Date of Visit: 02/29/2024   SUBJECTIVE:   HPI:  Patrick Dunlap presents today for hypertension follow up.  Hypertension - taking amlodipine -valsartan  5-160mg  daily. Tolerating this well. Does not check BP at home.  Eczema - desires change from hydrocortisone  ointment to cream, thinks the cream worked better for him.   OBJECTIVE:   BP (!) 141/106   Pulse 67   Ht 5\' 7"  (1.702 m)   Wt 184 lb (83.5 kg)   SpO2 98%   BMI 28.82 kg/m  Gen: no acute distress, pleasant, cooperative, well appearing HEENT: normocephalic, atraumatic  Heart: regular rate and rhythm, no murmur Lungs: clear to auscultation bilaterally, normal work of breathing  Neuro: alert speech normal, grossly nonfocal  ASSESSMENT/PLAN:   Assessment & Plan Primary hypertension Uncontrolled, blood pressure still above goal Increase medication to amlodipine -valsartan  10-160mg  daily Follow up in 1-2 weeks for RN blood pressure check Eczema, unspecified type Changed to hydrocortisone  cream from ointment per patient request    FOLLOW UP: Follow up in 1-2wks for RN BP check  [place] J. Dawn Eth, MD [facility]

## 2024-02-29 NOTE — Patient Instructions (Signed)
 It was great to see you again today.  Changing dose of blood pressure medication to amlodipine -valsartan  10-160mg  daily.  Schedule nurse blood pressure check in 1-2 weeks  Be well, Dr. Dawn Eth

## 2024-03-01 LAB — C. TRACHOMATIS/N. GONORRHOEAE RNA
C. trachomatis RNA, TMA: NOT DETECTED
N. gonorrhoeae RNA, TMA: NOT DETECTED

## 2024-03-01 LAB — GC/CHLAMYDIA PROBE, AMP (THROAT)
Chlamydia trachomatis RNA: NOT DETECTED
Neisseria gonorrhoeae RNA: NOT DETECTED

## 2024-03-01 NOTE — Assessment & Plan Note (Signed)
 Changed to hydrocortisone  cream from ointment per patient request

## 2024-03-01 NOTE — Assessment & Plan Note (Signed)
 Uncontrolled, blood pressure still above goal Increase medication to amlodipine -valsartan  10-160mg  daily Follow up in 1-2 weeks for RN blood pressure check

## 2024-03-02 LAB — HIV-1 RNA QUANT-NO REFLEX-BLD
HIV 1 RNA Quant: <20 {copies}/mL — AB
HIV-1 RNA Quant, Log: <1.3 {Log_copies}/mL — AB

## 2024-03-02 LAB — RPR: RPR Ser Ql: NONREACTIVE

## 2024-03-07 ENCOUNTER — Ambulatory Visit

## 2024-04-18 ENCOUNTER — Other Ambulatory Visit: Payer: Self-pay | Admitting: Pharmacist

## 2024-04-18 DIAGNOSIS — B20 Human immunodeficiency virus [HIV] disease: Secondary | ICD-10-CM

## 2024-04-19 ENCOUNTER — Other Ambulatory Visit: Payer: Self-pay

## 2024-04-19 MED ORDER — CETIRIZINE HCL 10 MG PO TABS: 10.0000 mg | ORAL_TABLET | Freq: Every day | ORAL | 0 refills | Status: AC

## 2024-05-06 ENCOUNTER — Ambulatory Visit: Admitting: Family

## 2024-07-25 ENCOUNTER — Ambulatory Visit: Admitting: Family

## 2024-08-03 ENCOUNTER — Other Ambulatory Visit: Payer: Self-pay

## 2024-08-03 ENCOUNTER — Other Ambulatory Visit

## 2024-08-03 DIAGNOSIS — B20 Human immunodeficiency virus [HIV] disease: Secondary | ICD-10-CM

## 2024-08-03 DIAGNOSIS — Z113 Encounter for screening for infections with a predominantly sexual mode of transmission: Secondary | ICD-10-CM

## 2024-08-04 LAB — T-HELPER CELL (CD4) - (RCID CLINIC ONLY)
CD4 % Helper T Cell: 43 % (ref 33–65)
CD4 T Cell Abs: 701 /uL (ref 400–1790)

## 2024-08-05 LAB — COMPLETE METABOLIC PANEL WITHOUT GFR
AG Ratio: 1.8 (calc) (ref 1.0–2.5)
ALT: 32 U/L (ref 9–46)
AST: 41 U/L — ABNORMAL HIGH (ref 10–40)
Albumin: 4.7 g/dL (ref 3.6–5.1)
Alkaline phosphatase (APISO): 60 U/L (ref 36–130)
BUN: 13 mg/dL (ref 7–25)
CO2: 27 mmol/L (ref 20–32)
Calcium: 10 mg/dL (ref 8.6–10.3)
Chloride: 106 mmol/L (ref 98–110)
Creat: 1.16 mg/dL (ref 0.60–1.26)
Globulin: 2.6 g/dL (ref 1.9–3.7)
Glucose, Bld: 91 mg/dL (ref 65–99)
Potassium: 4.1 mmol/L (ref 3.5–5.3)
Sodium: 141 mmol/L (ref 135–146)
Total Bilirubin: 0.4 mg/dL (ref 0.2–1.2)
Total Protein: 7.3 g/dL (ref 6.1–8.1)

## 2024-08-05 LAB — HIV-1 RNA QUANT-NO REFLEX-BLD
HIV 1 RNA Quant: 23 {copies}/mL — ABNORMAL HIGH
HIV-1 RNA Quant, Log: 1.36 {Log_copies}/mL — ABNORMAL HIGH

## 2024-08-05 LAB — CBC WITH DIFFERENTIAL/PLATELET
Absolute Lymphocytes: 1851 {cells}/uL (ref 850–3900)
Absolute Monocytes: 548 {cells}/uL (ref 200–950)
Basophils Absolute: 33 {cells}/uL (ref 0–200)
Basophils Relative: 0.4 %
Eosinophils Absolute: 116 {cells}/uL (ref 15–500)
Eosinophils Relative: 1.4 %
HCT: 43.6 % (ref 38.5–50.0)
Hemoglobin: 14.6 g/dL (ref 13.2–17.1)
MCH: 29.9 pg (ref 27.0–33.0)
MCHC: 33.5 g/dL (ref 32.0–36.0)
MCV: 89.3 fL (ref 80.0–100.0)
MPV: 10.6 fL (ref 7.5–12.5)
Monocytes Relative: 6.6 %
Neutro Abs: 5752 {cells}/uL (ref 1500–7800)
Neutrophils Relative %: 69.3 %
Platelets: 289 Thousand/uL (ref 140–400)
RBC: 4.88 Million/uL (ref 4.20–5.80)
RDW: 12.9 % (ref 11.0–15.0)
Total Lymphocyte: 22.3 %
WBC: 8.3 Thousand/uL (ref 3.8–10.8)

## 2024-08-05 LAB — RPR: RPR Ser Ql: NONREACTIVE

## 2024-08-17 ENCOUNTER — Ambulatory Visit: Admitting: Family

## 2024-10-06 ENCOUNTER — Ambulatory Visit

## 2024-10-06 VITALS — BP 146/118 | HR 77 | Ht 67.0 in | Wt 174.6 lb

## 2024-10-06 DIAGNOSIS — I10 Essential (primary) hypertension: Secondary | ICD-10-CM | POA: Diagnosis present

## 2024-10-06 MED ORDER — CHLORTHALIDONE 25 MG PO TABS: 25.0000 mg | ORAL_TABLET | Freq: Every day | ORAL | 3 refills | Status: AC

## 2024-10-06 NOTE — Progress Notes (Signed)
" ° ° °  SUBJECTIVE:   CHIEF COMPLAINT / HPI:   HTN - Started on amlodipine -valsartan  10-160 mg but has not taken consistently because it gives him a mental fog. He was on amlodipine  alone many years ago but did not take frequently and cannot remember how it made him feel - currently he has no symptoms.  - No vision changes, HA, swelling, increased urinary frequency - Strong family hx of HTN. Denies family hx of ESRD   BP Readings from Last 6 Encounters:  10/06/24 (!) 146/118  02/29/24 (!) 141/106  12/22/23 (!) 151/110  11/02/23 (!) 175/123  01/26/23 (!) 152/98  12/05/22 (!) 167/114     PERTINENT  PMH / PSH: HIV  OBJECTIVE:   BP (!) 129/112   Pulse 81   Ht 5' 7 (1.702 m)   Wt 174 lb 9.6 oz (79.2 kg)   SpO2 100%   BMI 27.35 kg/m    Physical Exam General: Alert, conversant, cooperative. No acute distress.  HEENT: PERRL. EOMI. MMM.  Cardiovascular: RRR Respiratory: Lungs CTAB. Normal work of breathing. Skin: Warm. Dry. No rashes. No icterus.  Neurologic: No focal deficits. Moving all extremities. Psychiatric: Cooperative. Appropriate mood. Appropriate affect.   ASSESSMENT/PLAN:   Assessment & Plan Primary hypertension Discussed importance of long term BP control. Pt has not been taking amlodipine -valsartan  due to side effects. Will switch to 25 mg chlorthalidone  alone. He will likely need multiple agents, however given his side effects, will slowly add to see what he can tolerate. Additionally, will refer to Dr. Rennis clinic for assistance. BMP ordered today and repeat at FU.      Milda LITTIE Deed, MD [facility] "

## 2024-10-06 NOTE — Assessment & Plan Note (Addendum)
 Discussed importance of long term BP control. Pt has not been taking amlodipine -valsartan  due to side effects. Will switch to 25 mg chlorthalidone  alone. He will likely need multiple agents, however given his side effects, will slowly add to see what he can tolerate. Additionally, will refer to Dr. Rennis clinic for assistance. BMP ordered today and repeat at FU.

## 2024-10-06 NOTE — Patient Instructions (Signed)
 It was good to see you today.   Please bring ALL of your medications with you to every visit.    Today we talked about: HTN- stopped amlodipine -valsartan . Start taking 25mg  chlorthalidone  daily. Will get kidney labs today and repeat at FU in appx 1 month.     Thank you for choosing [facility]. Please refer to your mychart for specifics regarding today's visit or future appointments.

## 2024-10-07 ENCOUNTER — Ambulatory Visit: Payer: Self-pay

## 2024-10-07 LAB — BASIC METABOLIC PANEL WITH GFR
BUN/Creatinine Ratio: 12 (ref 9–20)
BUN: 13 mg/dL (ref 6–20)
CO2: 22 mmol/L (ref 20–29)
Calcium: 9.6 mg/dL (ref 8.7–10.2)
Chloride: 107 mmol/L — ABNORMAL HIGH (ref 96–106)
Creatinine, Ser: 1.09 mg/dL (ref 0.76–1.27)
Glucose: 95 mg/dL (ref 70–99)
Potassium: 4.1 mmol/L (ref 3.5–5.2)
Sodium: 144 mmol/L (ref 134–144)
eGFR: 92 mL/min/1.73

## 2024-10-19 ENCOUNTER — Telehealth: Payer: Self-pay | Admitting: Pharmacist

## 2024-10-19 NOTE — Telephone Encounter (Signed)
 Attempted to contact patient for scheduling of Amb Blood Pressure appointments.   Patient scheduled for 2/9-06/2025 for Amb Blood Pressure monitoring.   Total time with patient call and documentation of interaction: 9 minutes.

## 2024-10-31 ENCOUNTER — Ambulatory Visit: Admitting: Pharmacist

## 2024-11-01 ENCOUNTER — Ambulatory Visit: Admitting: Pharmacist

## 2024-11-02 ENCOUNTER — Ambulatory Visit: Admitting: Pharmacist

## 2024-11-14 ENCOUNTER — Ambulatory Visit
